# Patient Record
Sex: Male | Born: 1960 | Race: White | Hispanic: No | Marital: Married | Smoking: Never smoker
Health system: Southern US, Community
[De-identification: ages and names within clinical notes are randomized; demographics above are authoritative.]

## PROBLEM LIST (undated history)

## (undated) DIAGNOSIS — I351 Nonrheumatic aortic (valve) insufficiency: Secondary | ICD-10-CM

## (undated) DIAGNOSIS — E785 Hyperlipidemia, unspecified: Secondary | ICD-10-CM

## (undated) DIAGNOSIS — K589 Irritable bowel syndrome without diarrhea: Secondary | ICD-10-CM

## (undated) DIAGNOSIS — I479 Paroxysmal tachycardia, unspecified: Secondary | ICD-10-CM

## (undated) DIAGNOSIS — I714 Abdominal aortic aneurysm, without rupture, unspecified: Secondary | ICD-10-CM

## (undated) DIAGNOSIS — Z860100 Personal history of colon polyps, unspecified: Secondary | ICD-10-CM

## (undated) HISTORY — DX: Paroxysmal tachycardia, unspecified: I47.9

## (undated) HISTORY — DX: Hyperlipidemia, unspecified: E78.5

## (undated) HISTORY — DX: Nonrheumatic aortic (valve) insufficiency: I35.1

## (undated) HISTORY — DX: Irritable bowel syndrome, unspecified: K58.9

## (undated) HISTORY — DX: Personal history of colon polyps, unspecified: Z86.0100

## (undated) HISTORY — DX: Abdominal aortic aneurysm, without rupture, unspecified: I71.40

---

## 2003-09-20 ENCOUNTER — Encounter: Admission: RE | Admit: 2003-09-20 | Discharge: 2003-09-20 | Payer: Self-pay | Admitting: Family Medicine

## 2003-09-20 ENCOUNTER — Encounter: Payer: Self-pay | Admitting: Family Medicine

## 2009-05-30 ENCOUNTER — Encounter: Admission: RE | Admit: 2009-05-30 | Discharge: 2009-05-30 | Payer: Self-pay | Admitting: Family Medicine

## 2010-12-22 ENCOUNTER — Encounter: Payer: Self-pay | Admitting: Family Medicine

## 2013-10-03 ENCOUNTER — Other Ambulatory Visit (HOSPITAL_COMMUNITY): Payer: Self-pay | Admitting: Family Medicine

## 2013-10-03 DIAGNOSIS — I359 Nonrheumatic aortic valve disorder, unspecified: Secondary | ICD-10-CM

## 2013-10-05 ENCOUNTER — Ambulatory Visit (HOSPITAL_COMMUNITY): Payer: BC Managed Care – PPO | Attending: Internal Medicine | Admitting: Radiology

## 2013-10-05 DIAGNOSIS — E785 Hyperlipidemia, unspecified: Secondary | ICD-10-CM | POA: Insufficient documentation

## 2013-10-05 DIAGNOSIS — I359 Nonrheumatic aortic valve disorder, unspecified: Secondary | ICD-10-CM

## 2013-10-05 DIAGNOSIS — I059 Rheumatic mitral valve disease, unspecified: Secondary | ICD-10-CM | POA: Insufficient documentation

## 2013-10-05 DIAGNOSIS — I77819 Aortic ectasia, unspecified site: Secondary | ICD-10-CM | POA: Insufficient documentation

## 2013-10-05 NOTE — Progress Notes (Signed)
Echocardiogram performed.  

## 2013-11-01 ENCOUNTER — Encounter: Payer: BC Managed Care – PPO | Admitting: Surgery

## 2013-11-01 ENCOUNTER — Other Ambulatory Visit: Payer: Self-pay | Admitting: Family Medicine

## 2013-11-01 DIAGNOSIS — I359 Nonrheumatic aortic valve disorder, unspecified: Secondary | ICD-10-CM

## 2013-11-06 ENCOUNTER — Other Ambulatory Visit: Payer: BC Managed Care – PPO

## 2013-11-29 ENCOUNTER — Encounter: Payer: BC Managed Care – PPO | Admitting: Surgery

## 2013-11-29 ENCOUNTER — Encounter: Payer: BC Managed Care – PPO | Admitting: Vascular Surgery

## 2013-11-29 ENCOUNTER — Other Ambulatory Visit (HOSPITAL_COMMUNITY): Payer: BC Managed Care – PPO

## 2013-12-01 ENCOUNTER — Ambulatory Visit
Admission: RE | Admit: 2013-12-01 | Discharge: 2013-12-01 | Disposition: A | Discharge status: Home / Self Care | Payer: Self-pay | Source: Ambulatory Visit | Attending: Family Medicine | Admitting: Family Medicine

## 2013-12-01 DIAGNOSIS — I359 Nonrheumatic aortic valve disorder, unspecified: Secondary | ICD-10-CM

## 2013-12-01 MED ORDER — IOHEXOL 350 MG/ML SOLN
80.0000 mL | Freq: Once | INTRAVENOUS | Status: AC | PRN
  Administered 2013-12-01: 80 mL via INTRAVENOUS

## 2013-12-06 ENCOUNTER — Encounter: Payer: Self-pay | Admitting: Surgery

## 2013-12-06 ENCOUNTER — Encounter: Payer: Self-pay | Admitting: *Deleted

## 2013-12-06 ENCOUNTER — Institutional Professional Consult (permissible substitution) (INDEPENDENT_AMBULATORY_CARE_PROVIDER_SITE_OTHER): Payer: BC Managed Care – PPO | Admitting: Surgery

## 2013-12-06 VITALS — BP 137/93 | HR 87 | Resp 16 | Ht 72.0 in | Wt 210.0 lb

## 2013-12-06 DIAGNOSIS — I712 Thoracic aortic aneurysm, without rupture, unspecified: Secondary | ICD-10-CM

## 2013-12-06 NOTE — Progress Notes (Signed)
PCP is Lupe Carney, MD Referring Provider is Lupe Carney, MD  Chief Complaint  Patient presents with  . Thoracic Aortic Aneurysm    Surgical eval on aortic aneurysm, ECHO 10/05/13, CTA Chest 11/01/2013    HPI:  53 year old gentleman with a hx of hyperlipidemia and known aortic root enlargement by prior echocardiogram several years ago. Recent 2D echo on 10/05/2013 showed an aortic root diameter of 4.5 cm and reportedly prior echo had shown a diameter of 4.2 cm. The aortic valve is trileaflet with trivial regurgitation. CT angio on 12/01/2013 shows the aortic root diameter to be 45 mm with tapering up to the aortic arch where the diameter is 23 mm.   He denies any chest pain but has had recent brief episodes of tachypalpitations that he feels may be related to excess caffeine intake.   Past Medical History  Diagnosis Date  . Hyperlipidemia   . Aortic insufficiency   . IBS (irritable bowel syndrome)   . Tachycardia, paroxysmal     History reviewed. No pertinent past surgical history.  Family history reviewed. His mother has a hx of heart disease but he does not have details.  Social History History  Substance Use Topics  . Smoking status: Never Smoker   . Smokeless tobacco: Never Used  . Alcohol Use: No    Current Outpatient Prescriptions  Medication Sig Dispense Refill  . hyoscyamine (ANASPAZ) 0.125 MG TBDP disintergrating tablet Place 0.125 mg under the tongue every 4 (four) hours as needed.        No current facility-administered medications for this visit.    Allergies  Allergen Reactions  . Simvastatin Other (See Comments)    Joint aches  . Ampicillin Rash    Review of Systems  Constitutional: Negative.   HENT: Negative.   Eyes: Negative.   Respiratory: Negative for chest tightness and shortness of breath.   Cardiovascular: Positive for palpitations. Negative for chest pain and leg swelling.       No PND or orthopnea  Gastrointestinal: Negative.    Endocrine: Negative.   Genitourinary: Negative.   Musculoskeletal: Negative.   Skin: Negative.   Allergic/Immunologic: Negative.   Neurological: Negative.   Hematological: Negative.   Psychiatric/Behavioral: Negative.     BP 137/93  Pulse 87  Resp 16  Ht 6' (1.829 m)  Wt 210 lb (95.255 kg)  BMI 28.47 kg/m2  SpO2 97% Physical Exam  Constitutional: He is oriented to person, place, and time. He appears well-developed and well-nourished. No distress.  HENT:  Head: Normocephalic and atraumatic.  Mouth/Throat: Oropharynx is clear and moist.  Eyes: EOM are normal. Pupils are equal, round, and reactive to light.  Neck: Normal range of motion. Neck supple. No JVD present. No thyromegaly present.  Cardiovascular: Normal rate, regular rhythm, normal heart sounds and intact distal pulses.  Exam reveals no gallop and no friction rub.   No murmur heard. Pulmonary/Chest: Effort normal and breath sounds normal. No respiratory distress. He has no wheezes. He has no rales.  Abdominal: Soft. Bowel sounds are normal. He exhibits no distension and no mass. There is no tenderness.  Musculoskeletal: Normal range of motion. He exhibits no edema.  Lymphadenopathy:    He has no cervical adenopathy.  Neurological: He is alert and oriented to person, place, and time. He has normal strength. No cranial nerve deficit or sensory deficit.  Skin: Skin is warm and dry.  Psychiatric: He has a normal mood and affect.  Diagnostic Tests:  [facility] [address] [place], [place] [address] [phone]  ------------------------------------------------------------ Transthoracic Echocardiography  Patient: Robert Madden, Robert Madden MR #: [id number] Study Date: 10/05/2013 Gender: M Age: 51 Height: 182.9cm Weight: 90.7kg BSA: 2.5763m^2 Pt. Status: Room:  ATTENDING Dietrich Patesoss, [facility], Echo SONOGRAPHER Aida RaiderNaTashia Rodgers, RDCS ORDERING Lupe CarneyMitchell, Dean Kristopher OppenheimEFERRING Mitchell,  Dean cc:  ------------------------------------------------------------ LV EF: 55% - 60%  ------------------------------------------------------------ Indications: Aortic Valve Disorder 424.1.  ------------------------------------------------------------ History: PMH: Acquired from the patient and from the patient's chart. PMH: Dilated Aortic Root. Aortic Insufficiency. Risk factors: Dyslipidemia.  ------------------------------------------------------------ Study Conclusions  - Left ventricle: The cavity size was normal. There was mild focal basal hypertrophy of the septum. Systolic function was normal. The estimated ejection fraction was in the range of 55% to 60%. Wall motion was normal; there were no regional wall motion abnormalities. - Aortic valve: Trivial regurgitation. - Aorta: The aorta was moderately dilated. - Mitral valve: Calcified annulus. Transthoracic echocardiography. M-mode, complete 2D, spectral Doppler, and color Doppler. Height: Height: 182.9cm. Height: 72in. Weight: Weight: 90.7kg. Weight: 199.6lb. Body mass index: BMI: 27.1kg/m^2. Body surface area: BSA: 2.11063m^2. Patient status: Outpatient.  ------------------------------------------------------------  ------------------------------------------------------------ Left ventricle: The cavity size was normal. There was mild focal basal hypertrophy of the septum. Systolic function was normal. The estimated ejection fraction was in the range of 55% to 60%. Wall motion was normal; there were no regional wall motion abnormalities.  ------------------------------------------------------------ Aortic valve: Trileaflet. Doppler: There was no stenosis. Trivial regurgitation.  ------------------------------------------------------------ Aorta: The aorta was moderately dilated.  ------------------------------------------------------------ Mitral valve: Calcified  annulus.  ------------------------------------------------------------ Left atrium: The atrium was normal in size.  ------------------------------------------------------------ Atrial septum: Poorly visualized.  ------------------------------------------------------------ Right ventricle: The cavity size was normal. Wall thickness was normal. Systolic function was normal.  ------------------------------------------------------------ Pulmonic valve: Structurally normal valve. Cusp separation was normal. Doppler: Transvalvular velocity was within the normal range. Trivial regurgitation.  ------------------------------------------------------------ Tricuspid valve: Structurally normal valve. Leaflet separation was normal. Doppler: Transvalvular velocity was within the normal range. No regurgitation.  ------------------------------------------------------------ Pulmonary artery: Poorly visualized.  ------------------------------------------------------------ Right atrium: The atrium was normal in size.  ------------------------------------------------------------ Pericardium: There was no pericardial effusion.  ------------------------------------------------------------ Systemic veins: Inferior vena cava: The vessel was normal in size; the respirophasic diameter changes were in the normal range (= 50%); findings are consistent with normal central venous pressure.  ------------------------------------------------------------ Post procedure conclusions Ascending Aorta:  - The aorta was moderately dilated.  ------------------------------------------------------------  2D measurements Normal Doppler measurements Normal Left ventricle Left ventricle LVID ED, 37.3 mm 43-52 Ea, lat 11.2 cm/s ------ chord, ann, tiss PLAX DP LVID ES, 21.5 mm 23-38 E/Ea, lat 5.94 ------ chord, ann, tiss PLAX DP FS, chord, 42 % >29 Ea, med 8.33 cm/s ------ PLAX ann, tiss LVPW, ED 9.59 mm  ------ DP IVS/LVPW 1.67 <1.3 E/Ea, med 7.98 ------ ratio, ED ann, tiss Ventricular septum DP IVS, ED 16 mm ------ LVOT LVOT Peak vel, 118 cm/s ------ Diam, S 22 mm ------ S Area 3.8 cm^2 ------ VTI, S 27.3 cm ------ Diam 22 mm ------ Peak 6 mm Hg ------ Aorta gradient, Root diam, 45 mm ------ S ED Stroke vol 103.8 ml ------ Left atrium Stroke 48.7 ml/m^ ------ AP dim 36 mm ------ index 2 AP dim 1.69 cm/m^2 <2.2 Mitral valve index Peak E vel 66.5 cm/s ------ Peak A vel 49.4 cm/s ------ Decelerati 215 ms 150-23 on time 0 Peak E/A 1.3 ------ ratio Right ventricle Sa vel, 14.4 cm/s ------ lat ann, tiss DP  ------------------------------------------------------------  Prepared and Electronically Authenticated by  Lyn Records. 2014-11-06T14:37:35.080   CLINICAL DATA: Evaluate size of the thoracic aorta. The thoracic  aorta measured approximately 4.2 cm on an outside examination  performed 06/2009, though cardiac echo suggests interval enlargement  currently measuring approximately 4.5 cm. Patient reports chest  discomfort and cardiac arrhythmia.  EXAM:  CT ANGIOGRAPHY CHEST WITH CONTRAST  TECHNIQUE:  Multidetector CT imaging of the chest was performed using the  standard protocol during bolus administration of intravenous  contrast. Multiplanar CT image reconstructions including MIPs were  obtained to evaluate the vascular anatomy.  CONTRAST: 80mL OMNIPAQUE IOHEXOL 350 MG/ML SOLN  COMPARISON: None.  FINDINGS:  Vascular Findings:  There is fusiform ectasia of the ascending thoracic aorta beginning  at the level of the aortic root. The thoracic aorta tapers to a  normal caliber at the level of the aortic arch. Incidental note is  made of a small ductus diverticulum. No thoracic aortic dissection  or periaortic stranding.  The left vertebral artery is and filling noted to arise directly  from the aortic arch. The branch vessels of the aortic arch are  widely patent  throughout their imaged course.  Borderline cardiomegaly. No pericardial effusion. No definitive  calcifications within the aortic valve leaflets.  The imaged abdominal aorta and major branch vessels appear normal.  -------------------------------------------------------------  Thoracic aortic measurements:  Sinuses of Valsalva:  45 mm as measured in greatest oblique coronal dimension (coronal  image 56, series 601).  Sinotubular junction  41 mm as measured in greatest oblique coronal dimension.  Proximal ascending aorta  35 mm as measured in greatest oblique axial dimension at the level  of the main pulmonary artery. Approximately 36 mm in greatest  oblique coronal dimension (coronal image 51, series 601).  Aortic arch aorta  23 mm as measured in greatest oblique sagittal dimension.  Proximal descending thoracic aorta  23 mm as measured in greatest oblique axial dimension at the level  of the main pulmonary artery.  Distal descending thoracic aorta  22 mm as measured in greatest oblique axial dimension at the level  of the diaphragmatic hiatus.  Review of the MIP images confirms the above findings.  -------------------------------------------------------------  Non-Vascular Findings:  There is a minimal grossly symmetric subpleural ground-glass  atelectasis within the dependent portions of the bilateral upper and  lower lobes. No discrete focal airspace opacities. No discrete  pulmonary nodules. No pleural effusion or pneumothorax. The central  pulmonary airways appear widely patent.  Scattered shotty mediastinal lymph nodes are individually not  enlarged by size criteria. Shotty right infrahilar lymph nodes are  not enlarged by size criteria with index right infrahilar nodal  conglomeration measuring approximately 0.9 cm in diameter (image 66,  series 3). No mediastinal, hilar or axillary lymphadenopathy.  Early arterial phase evaluation of the upper abdomen demonstrates a   small spine normal but is otherwise unremarkable.  No acute or aggressive osseous abnormalities. Normal appearance of  the thyroid gland.  IMPRESSION:  Fusiform ectasia of the aortic root measuring approximately 45 mm at  the level of the sinuses of Valsalva. The thoracic aorta tapers to a  normal caliber at the level of the aortic arch. No thoracic aortic  dissection or periaortic stranding. No definitive aortic valve  calcifications on this nongated examination.  Electronically Signed  By: Simonne Come M.Madden.  On: 12/01/2013 10:39    Impression:  He has stable fusiform enlargement of the aortic root to 45 mm at the level of the  aortic sinuses with a trileaflet valve which is probably unchanged compared to his previous echo a few years ago. The difference between 42 mm and 45 mm by echo is within the variance for that technigue which is technologist dependent. The aortic root at the sinus level is usually larger than the ascending aorta. I would not recommend surgery for this unless there is rapid enlargement of 10 mm in a year or if it reached 5.5 cm. It may stay stable for a long time. At its current size the risk of surgery is higher than the risk of aortic complications. I would recommend continued follow up with a repeat CTA in 1 year. His tachypalpitations are unrelated to this and could be due to sensitivity to caffeine as he has noted. If these persist he may require further evaluation. He does have some hypertension and with an aneurysm he would benefit from a beta blocker to decrease the aortic shear force. This may also help his palpitations. I will leave that decision up to Dr. Clovis Riley.   Plan:  I will see him back in 1 year with a CTA of the chest.

## 2014-02-27 LAB — HM COLONOSCOPY

## 2014-11-28 ENCOUNTER — Other Ambulatory Visit: Payer: Self-pay | Admitting: *Deleted

## 2014-11-28 DIAGNOSIS — I712 Thoracic aortic aneurysm, without rupture, unspecified: Secondary | ICD-10-CM

## 2014-12-04 ENCOUNTER — Other Ambulatory Visit: Payer: Self-pay

## 2014-12-04 DIAGNOSIS — I712 Thoracic aortic aneurysm, without rupture, unspecified: Secondary | ICD-10-CM

## 2014-12-19 ENCOUNTER — Ambulatory Visit (INDEPENDENT_AMBULATORY_CARE_PROVIDER_SITE_OTHER): Payer: BC Managed Care – PPO | Admitting: Surgery

## 2014-12-19 ENCOUNTER — Encounter: Payer: Self-pay | Admitting: Surgery

## 2014-12-19 ENCOUNTER — Ambulatory Visit
Admission: RE | Admit: 2014-12-19 | Discharge: 2014-12-19 | Disposition: A | Discharge status: Home / Self Care | Payer: BC Managed Care – PPO | Source: Ambulatory Visit | Attending: Surgery | Admitting: Surgery

## 2014-12-19 VITALS — BP 137/88 | HR 140 | Resp 20 | Ht 72.0 in | Wt 210.0 lb

## 2014-12-19 DIAGNOSIS — I712 Thoracic aortic aneurysm, without rupture, unspecified: Secondary | ICD-10-CM

## 2014-12-19 MED ORDER — IOHEXOL 350 MG/ML SOLN
75.0000 mL | Freq: Once | INTRAVENOUS | Status: AC | PRN
  Administered 2014-12-19: 75 mL via INTRAVENOUS

## 2014-12-19 NOTE — Progress Notes (Signed)
HPI:  Robert Madden returns for follow up of an enlarged aortic root. I last saw him on 12/06/2013 for an initial consultation and his aortic root was 45 mm at that time. His aortic valve was noted to be tricuspid on echo from 10/05/2013. Robert Madden has felt well over the past year with no chest or back pain and no shortness of breath.   Current Outpatient Prescriptions  Medication Sig Dispense Refill  . atorvastatin (LIPITOR) 20 MG tablet Take 20 mg by mouth daily at 6 PM.   2  . hyoscyamine (ANASPAZ) 0.125 MG TBDP disintergrating tablet Place 0.125 mg under the tongue every 4 (four) hours as needed.      No current facility-administered medications for this visit.     Physical Exam: BP 137/88 mmHg  Pulse 140  Resp 20  Ht 6' (1.829 m)  Wt 210 lb (95.255 kg)  BMI 28.47 kg/m2  SpO2 98% Robert Madden looks well Cardiac exam shows a regular rate and rhythm with tachycardia and no murmur Lungs are clear  Diagnostic Tests:  CLINICAL DATA: Follow-up thoracic aortic aneurysm.  EXAM: CT ANGIOGRAPHY CHEST WITH CONTRAST  TECHNIQUE: Multidetector CT imaging of the chest was performed using the standard protocol during bolus administration of intravenous contrast. Multiplanar CT image reconstructions and MIPs were obtained to evaluate the vascular anatomy.  CONTRAST: 75mL OMNIPAQUE IOHEXOL 350 MG/ML SOLN  COMPARISON: Chest CT-12/01/2013  FINDINGS: Vascular Findings:  There is fusiform aneurysmal dilatation of the aortic root and proximal ascending thoracic aorta, minimally increased in the interval with measurements as follows. The ascending thoracic aorta tapers to a normal caliber at the level of the aortic arch. No definite thoracic aortic dissection or periaortic stranding on this nongated examination.  The left vertebral artery is incidentally noted to arise directly from the aortic arch. The branch vessels of the aortic arch are widely patent throughout their imaged course.  Incidental note is made of a small ductus diverticulum.  Normal heart size. No pericardial effusion though there is a trace amount of fluid seen within the pericardial recess, similar to the prior examination.  Normal caliber the main pulmonary artery. Although this examination was not tailored for the evaluation of the pulmonary arteries, there are no discrete filling defects within the central pulmonary arterial tree to suggest central pulmonary embolism.  -------------------------------------------------------------  Thoracic aortic measurements:  Sinuses of Valsalva:  47 mm as measured in greatest oblique coronal dimension (image 53, series 601), previously, 45 mm.  Sinotubular junction  42 mm as measured in greatest oblique coronal dimension, previously, 41 mm.  Proximal ascending aorta  39 mm as measured in greatest oblique axial dimension at the level of the main pulmonary artery (image 66, series 4), previously, 35 mm; and approximately 38 mm in greatest oblique coronal dimension (coronal image 49, series 601), previously, 36 mm.  Aortic arch aorta  24 mm as measured in greatest oblique sagittal dimension.  Proximal descending thoracic aorta  24 mm as measured in greatest oblique axial dimension at the level of the main pulmonary artery.  Distal descending thoracic aorta  23 mm as measured in greatest oblique axial dimension at the level of the diaphragmatic hiatus.  Review of the MIP images confirms the above findings.  -------------------------------------------------------------  Non-Vascular Findings:  Minimal dependent subpleural ground-glass atelectasis. No discrete focal airspace opacities. No pleural effusion or pneumothorax. The central pulmonary airways appear widely patent.  No discrete pulmonary nodules. Scattered shotty mediastinal lymph nodes are individually not enlarged  by size criteria. No mediastinal, hilar or  axial lymphadenopathy.  Limited early arterial phase evaluation of the upper abdomen is normal. Incidental note is made of a small splenule.  No acute or aggressive osseous abnormalities.  Regional soft tissues appear normal. Normal appearance of the thyroid gland.  IMPRESSION: Minimal increase in fusiform ectasia involving the aortic root and proximal ascending thoracic aorta, now measuring 47 mm at the level of the sinuses of Valsalva (previously - 45 mm) and 39 mm at the level of the proximal ascending thoracic aorta (previously - 35 mm). No definite thoracic aortic dissection or periaortic stranding.   Electronically Signed  By: Simonne ComeJohn Watts M.D.  On: 12/19/2014 14:56    Impression:  There has been a slight increase in the size of his aortic root, from 4.5 cm to 4.7 cm. This could still be within the limits of error depending on the scan and where there diameter was measured. There is no indication for surgical intervention unless there is slow progressive enlargement up to 5.5 cm or rapid enlargement of 5 mm or greater in 1 year. Robert Madden is tachycardic today and reports a history of brief episodes of tachypalpitations like this in the past that are asymptomatic although Robert Madden knows when they occur. Robert Madden feels that caffeine plays a part but not exertion. Robert Madden has not wanted to work them up further since they don't occur very often and don't last long.   Plan:  I will see him back in 1 year with a CTA of the chest to reassess the aortic root aneurysm.

## 2014-12-26 ENCOUNTER — Ambulatory Visit: Payer: Self-pay | Admitting: Surgery

## 2015-02-25 DIAGNOSIS — E7800 Pure hypercholesterolemia, unspecified: Secondary | ICD-10-CM | POA: Insufficient documentation

## 2015-08-30 ENCOUNTER — Encounter: Payer: Self-pay | Admitting: Physician Assistant

## 2015-08-30 ENCOUNTER — Ambulatory Visit (INDEPENDENT_AMBULATORY_CARE_PROVIDER_SITE_OTHER): Payer: BC Managed Care – PPO | Admitting: Physician Assistant

## 2015-08-30 VITALS — BP 120/80 | HR 88 | Ht 72.0 in | Wt 218.0 lb

## 2015-08-30 DIAGNOSIS — I712 Thoracic aortic aneurysm, without rupture, unspecified: Secondary | ICD-10-CM

## 2015-08-30 DIAGNOSIS — I471 Supraventricular tachycardia: Secondary | ICD-10-CM | POA: Insufficient documentation

## 2015-08-30 MED ORDER — METOPROLOL TARTRATE 25 MG PO TABS: 25.0000 mg | ORAL_TABLET | Freq: Two times a day (BID) | ORAL | Status: DC

## 2015-08-30 NOTE — Addendum Note (Signed)
Addended by: Reesa Chew on: 08/30/2015 03:33 PM   Modules accepted: Medications

## 2015-08-30 NOTE — Progress Notes (Signed)
Patient ID: Robert Madden, male   DOB: March 29, 1961, 54 y.o.   MRN: [id number]    Date:  08/30/2015   ID:  Robert Madden, DOB [birth year], MRN [id number]  PCP:  Lupe Carney, MD  Primary Cardiologist:  new   Chief complaint:  Rapid heart rate   History of Present Illness: Robert Madden is a 54 y.o. male with a history of hyperlipidemia, aortic insufficiency, IBS, paroxysmal tachycardia, hyperlipidemia.  He had an echocardiogram 10/05/2013 which reveals an ejection fraction 55-60% with normal wall motion the aorta was mild moderately dilated aortic valve with trivial regurgitation mitral valve calcified annulus right ventricle was normal in size and function trivial pulmonic valve regurgitation.  His CT angiogram of the chest January 2015 which revealed fusiform ectasia of the aortic root measuring 45 mm at the level of the sinuses of Valsalva. His most recent CT showed mild increase in size to 4.7 cm.  He is followed by Dr. Lavinia Sharps.  Patient resents with complaints of a randomly occurring rapid heart rate.  The longest occurrence was about 7 minutes.  The first occurred back in January with intractable frequency since July. This probably occurred in the range of 20-30 times. Denies any dizziness or lightheadedness or presyncopal symptoms. No chest pain or tightness.    The patient currently denies nausea, vomiting, fever, orthopnea, dizziness, PND, cough, congestion, abdominal pain, hematochezia, melena, lower extremity edema, claudication.  Wt Readings from Last 3 Encounters:  08/30/15 218 lb (98.884 kg)  12/19/14 210 lb (95.255 kg)  12/06/13 210 lb (95.255 kg)     Past Medical History  Diagnosis Date  . Hyperlipidemia   . Aortic insufficiency   . IBS (irritable bowel syndrome)   . Tachycardia, paroxysmal     Current Outpatient Prescriptions  Medication Sig Dispense Refill  . aspirin 81 MG tablet Take 81 mg by mouth daily.    Marland Kitchen FLUVIRIN PRESERVATIVE FREE 0.5 ML SUSY inject  0.5 milliliter intramuscularly  0  . hyoscyamine (ANASPAZ) 0.125 MG TBDP disintergrating tablet Place 0.125 mg under the tongue every 4 (four) hours as needed (reflux).     . pravastatin (PRAVACHOL) 40 MG tablet TK 1 T PO QD  12  . ZOSTAVAX 75643 UNT/0.65ML injection inject contents of 1 vial subcutaneously  0  . metoprolol tartrate (LOPRESSOR) 25 MG tablet Take 1 tablet (25 mg total) by mouth 2 (two) times daily. 180 tablet 3   No current facility-administered medications for this visit.    Allergies:    Allergies  Allergen Reactions  . Simvastatin Other (See Comments)    Joint aches  . Ampicillin Rash    Social History:  The patient  reports that he has never smoked. He has never used smokeless tobacco. He reports that he does not drink alcohol or use illicit drugs.   Family history:  No family history on file.  ROS:  Please see the history of present illness.  All other systems reviewed and negative.   PHYSICAL EXAM: VS:  BP 120/80 mmHg  Pulse 88  Ht 6' (1.829 m)  Wt 218 lb (98.884 kg)  BMI 29.56 kg/m2 Well nourished, well developed, in no acute distress HEENT: Pupils are equal round react to light accommodation extraocular movements are intact.  Neck: no JVDNo cervical lymphadenopathy. Cardiac: Regular rhythm rate fast no murmurs rubs or gallops Lungs:  clear to auscultation bilaterally, no wheezing, rhonchi or rales Abd: soft, nontender, positive bowel sounds all quadrants, no hepatosplenomegaly Ext: no lower extremity  edema.  2+ radial and dorsalis pedis pulses. Skin: warm and dry Neuro:  Grossly normal  EKG:  PJRT rate 141  ASSESSMENT AND PLAN:  Problem List Items Addressed This Visit    Permanent junctional reciprocating tachycardia/ SVT   Relevant Medications   pravastatin (PRAVACHOL) 40 MG tablet   aspirin 81 MG tablet   metoprolol tartrate (LOPRESSOR) 25 MG tablet   Aortic aneurysm, thoracic - Primary   Relevant Medications   pravastatin (PRAVACHOL) 40 MG  tablet   aspirin 81 MG tablet   metoprolol tartrate (LOPRESSOR) 25 MG tablet   Other Relevant Orders   EKG 12-Lead    Other Visit Diagnoses    SVT (supraventricular tachycardia)        Relevant Medications    pravastatin (PRAVACHOL) 40 MG tablet    aspirin 81 MG tablet    metoprolol tartrate (LOPRESSOR) 25 MG tablet    Other Relevant Orders    Echocardiogram      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 He subsequently went into rapid heart rate of about 141 bpm. Dr. Graciela Husbands was notified subsequently applied carotid massage which broke the rhythm for very short period. Patient was administered 5 mg of by systolic.  Patient reverted back to normal sinus rhythm after about 25 minutes. He was started on Lopressor 25 mg twice daily. He'll continue to track the frequency of his episodes and will follow back up with Dr. Ladona Ridgel in 2 months.  We'll also check a 2-D echocardiogram.

## 2015-08-30 NOTE — Patient Instructions (Addendum)
Medication Instructions:  Your physician has recommended you make the following change in your medication:  1.  START Lopressor 25 mg taking 1 tablet twice a day.   Labwork: None ordered  Testing/Procedures: Your physician has requested that you have an echocardiogram. Echocardiography is a painless test that uses sound waves to create images of your heart. It provides your doctor with information about the size and shape of your heart and how well your heart's chambers and valves are working. This procedure takes approximately one hour. There are no restrictions for this procedure.    Follow-Up: Your physician wants you to follow-up in:  2 MONTHS WITH DR. Ladona Ridgel TO DISCUSS ABLATION.   SPECIAL INSTRUCTIONS:  Echocardiogram An echocardiogram, or echocardiography, uses sound waves (ultrasound) to produce an image of your heart. The echocardiogram is simple, painless, obtained within a short period of time, and offers valuable information to your health care provider. The images from an echocardiogram can provide information such as:  Evidence of coronary artery disease (CAD).  Heart size.  Heart muscle function.  Heart valve function.  Aneurysm detection.  Evidence of a past heart attack.  Fluid buildup around the heart.  Heart muscle thickening.  Assess heart valve function. LET [facility] CARE PROVIDER KNOW ABOUT:  Any allergies you have.  All medicines you are taking, including vitamins, herbs, eye drops, creams, and over-the-counter medicines.  Previous problems you or members of your family have had with the use of anesthetics.  Any blood disorders you have.  Previous surgeries you have had.  Medical conditions you have.  Possibility of pregnancy, if this applies. BEFORE THE PROCEDURE  No special preparation is needed. Eat and drink normally.  PROCEDURE   In order to produce an image of your heart, gel will be applied to your chest and a wand-like tool  (transducer) will be moved over your chest. The gel will help transmit the sound waves from the transducer. The sound waves will harmlessly bounce off your heart to allow the heart images to be captured in real-time motion. These images will then be recorded.  You may need an IV to receive a medicine that improves the quality of the pictures. AFTER THE PROCEDURE You may return to your normal schedule including diet, activities, and medicines, unless your health care provider tells you otherwise. Document Released: 11/13/2000 Document Revised: 04/02/2014 Document Reviewed: 07/24/2013 [facility] Patient Information 2015 [place], [place]. This information is not intended to replace advice given to you by your health care provider. Make sure you discuss any questions you have with your health care provider.   Cardiac Ablation Cardiac ablation is a procedure to disable a small amount of heart tissue in very specific places. The heart has many electrical connections. Sometimes these connections are abnormal and can cause the heart to beat very fast or irregularly. By disabling some of the problem areas, heart rhythm can be improved or made normal. Ablation is done for people who:   Have Wolff-Parkinson-White syndrome.   Have other fast heart rhythms (tachycardia).   Have taken medicines for an abnormal heart rhythm (arrhythmia) that resulted in:   No success.   Side effects.   May have a high-risk heartbeat that could result in death.  LET [facility] CARE PROVIDER KNOW ABOUT:   Any allergies you have or any previous reactions you have had to X-ray dye, food (such as seafood), medicine, or tape.   All medicines you are taking, including vitamins, herbs, eye drops, creams, and  over-the-counter medicines.   Previous problems you or members of your family have had with the use of anesthetics.   Any blood disorders you have.   Previous surgeries or procedures (such as a kidney  transplant) you have had.   Medical conditions you have (such as kidney failure).  RISKS AND COMPLICATIONS Generally, cardiac ablation is a safe procedure. However, problems can occur and include:   Increased risk of cancer. Depending on how long it takes to do the ablation, the dose of radiation can be high.  Bruising and bleeding where a thin, flexible tube (catheter) was inserted during the procedure.   Bleeding into the chest, especially into the sac that surrounds the heart (serious).  Need for a permanent pacemaker if the normal electrical system is damaged.   The procedure may not be fully effective, and this may not be recognized for months. Repeat ablation procedures are sometimes required. BEFORE THE PROCEDURE   Follow any instructions from your health care provider regarding eating and drinking before the procedure.   Take your medicines as directed at regular times with water, unless instructed otherwise by your health care provider. If you are taking diabetes medicine, including insulin, ask how you are to take it and if there are any special instructions you should follow. It is common to adjust insulin dosing the day of the ablation.  PROCEDURE  An ablation is usually performed in a catheterization laboratory with the guidance of fluoroscopy. Fluoroscopy is a type of X-ray that helps your health care provider see images of your heart during the procedure.   An ablation is a minimally invasive procedure. This means a small cut (incision) is made in either your neck or groin. Your health care provider will decide where to make the incision based on your medical history and physical exam.  An IV tube will be started before the procedure begins. You will be given an anesthetic or medicine to help you relax (sedative).  The skin on your neck or groin will be numbed. A needle will be inserted into a large vein in your neck or groin and catheters will be threaded to your  heart.  A special dye that shows up on fluoroscopy pictures may be injected through the catheter. The dye helps your health care provider see the area of the heart that needs treatment.  The catheter has electrodes on the tip. When the area of heart tissue that is causing the arrhythmia is found, the catheter tip will send an electrical current to the area and "scar" the tissue. Three types of energy can be used to ablate the heart tissue:   Heat (radiofrequency energy).   Laser energy.   Extreme cold (cryoablation).   When the area of the heart has been ablated, the catheter will be taken out. Pressure will be held on the insertion site. This will help the insertion site clot and keep it from bleeding. A bandage will be placed on the insertion site.  AFTER THE PROCEDURE   After the procedure, you will be taken to a recovery area where your vital signs (blood pressure, heart rate, and breathing) will be monitored. The insertion site will also be monitored for bleeding.   You will need to lie still for 4-6 hours. This is to ensure you do not bleed from the catheter insertion site.  Document Released: 04/04/2009 Document Revised: 04/02/2014 Document Reviewed: 04/10/2013 [facility] Patient Information 2015 [place], [place]. This information is not intended to replace advice given to  you by your health care provider. Make sure you discuss any questions you have with your health care provider.  

## 2015-09-09 ENCOUNTER — Other Ambulatory Visit: Payer: Self-pay

## 2015-09-09 ENCOUNTER — Ambulatory Visit (HOSPITAL_COMMUNITY): Payer: BC Managed Care – PPO | Attending: Physician Assistant

## 2015-09-09 DIAGNOSIS — I340 Nonrheumatic mitral (valve) insufficiency: Secondary | ICD-10-CM | POA: Diagnosis not present

## 2015-09-09 DIAGNOSIS — I517 Cardiomegaly: Secondary | ICD-10-CM | POA: Diagnosis not present

## 2015-09-09 DIAGNOSIS — I471 Supraventricular tachycardia: Secondary | ICD-10-CM | POA: Diagnosis present

## 2015-09-09 DIAGNOSIS — I351 Nonrheumatic aortic (valve) insufficiency: Secondary | ICD-10-CM | POA: Diagnosis not present

## 2015-09-09 DIAGNOSIS — E785 Hyperlipidemia, unspecified: Secondary | ICD-10-CM | POA: Diagnosis not present

## 2015-10-23 ENCOUNTER — Ambulatory Visit (INDEPENDENT_AMBULATORY_CARE_PROVIDER_SITE_OTHER): Payer: BC Managed Care – PPO | Admitting: Internal Medicine

## 2015-10-23 ENCOUNTER — Other Ambulatory Visit: Payer: Self-pay

## 2015-10-23 ENCOUNTER — Encounter: Payer: Self-pay | Admitting: Internal Medicine

## 2015-10-23 VITALS — BP 122/78 | HR 74 | Ht 71.75 in | Wt 220.8 lb

## 2015-10-23 DIAGNOSIS — I471 Supraventricular tachycardia: Secondary | ICD-10-CM

## 2015-10-23 DIAGNOSIS — I712 Thoracic aortic aneurysm, without rupture, unspecified: Secondary | ICD-10-CM

## 2015-10-23 MED ORDER — METOPROLOL SUCCINATE ER 50 MG PO TB24: 50.0000 mg | ORAL_TABLET | Freq: Every day | ORAL | Status: DC

## 2015-10-23 NOTE — Assessment & Plan Note (Signed)
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 I have discussed the treatment options in detail. Because he has improved on beta blocker therapy, I have recommended a period of watchful waiting. If he becomes intolerant of his beta blocker or has recurrent break through episodes of SVT, would strongly consider catheter ablation.

## 2015-10-23 NOTE — Assessment & Plan Note (Signed)
He will continue to undergo watchful waiting. He is asymptomatic.

## 2015-10-23 NOTE — Patient Instructions (Signed)
Medication Instructions:  Your physician has recommended you make the following change in your medication:  1) Stop the twice daily Metoprolol 2) Start Toprol 50mg  daily   Labwork: None ordered   Testing/Procedures: None ordered   Follow-Up: Your physician wants you to follow-up in: 12 months with Dr Court Joyaylor You will receive a reminder letter in the mail two months in advance. If you don't receive a letter, please call our office to schedule the follow-up appointment.   Any Other Special Instructions Will Be Listed Below (If Applicable).     If you need a refill on your cardiac medications before your next appointment, please call your pharmacy.

## 2015-10-23 NOTE — Progress Notes (Signed)
      HPI [name] is referred today by [name] for evaluation of SVT. 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 He was seen in our office several months ago with these symptoms and was found to have a long RP tachycardia which was terminated with carotid massage. He was placed on metoprolol and returns for initial examination. The patient noted episodes occuring several times a week, lasting up to 30 minutes. These would stop and start suddenly. He never had syncope. He was placed on metoprolol and his symptoms have improved. He actually feels better as his blood pressure is under better control. No other complaints.  Allergies  Allergen Reactions  . Simvastatin Other (See Comments)    Joint aches  . Ampicillin Rash     Current Outpatient Prescriptions  Medication Sig Dispense Refill  . aspirin 81 MG tablet Take 81 mg by mouth daily.    . hyoscyamine (ANASPAZ) 0.125 MG TBDP disintergrating tablet Place 0.125 mg under the tongue every 4 (four) hours as needed (reflux).     . pravastatin (PRAVACHOL) 40 MG tablet Take 1 tablet by mouth every other day  12  . metoprolol succinate (TOPROL-XL) 50 MG 24 hr tablet Take 1 tablet (50 mg total) by mouth daily. Take with or immediately following a meal. 90 tablet 3   No current facility-administered medications for this visit.     Past Medical History  Diagnosis Date  . Hyperlipidemia   . Aortic insufficiency   . IBS (irritable bowel syndrome)   . Tachycardia, paroxysmal (HCC)     ROS:   All systems reviewed and negative except as noted in the HPI.   No past surgical history on file.   Family History  Problem Relation Age of Onset  . Heart attack Neg Hx   . Stroke Neg Hx   . Hypertension Mother      Social History   Social History  . Marital Status: Married    Spouse Name: N/A  . Number of Children:  2  . Years of Education: N/A   Occupational History  . school principal    Social History Main Topics  . Smoking status: Never Smoker   . Smokeless tobacco: Never Used  . Alcohol Use: No  . Drug Use: No  . Sexual Activity: Not on file   Other Topics Concern  . Not on file   Social History Narrative     BP 122/78 mmHg  Pulse 74  Ht 5' 11.75" (1.822 m)  Wt 220 lb 12.8 oz (100.154 kg)  BMI 30.17 kg/m2  Physical Exam:  Well appearing middle aged man, NAD HEENT: Unremarkable Neck:  6 cm JVD, no thyromegally Lymphatics:  No adenopathy Back:  No CVA tenderness Lungs:  Clear with no wheezes HEART:  Regular rate rhythm, no murmurs, no rubs, no clicks Abd:  soft, positive bowel sounds, no organomegally, no rebound, no guarding Ext:  2 plus pulses, no edema, no cyanosis, no clubbing Skin:  No rashes no nodules Neuro:  CN II through XII intact, motor grossly intact  EKG - nsr with no pre-excitation.    Assess/Plan:

## 2015-11-20 ENCOUNTER — Other Ambulatory Visit: Payer: Self-pay | Admitting: *Deleted

## 2015-11-20 DIAGNOSIS — I712 Thoracic aortic aneurysm, without rupture, unspecified: Secondary | ICD-10-CM

## 2015-12-25 ENCOUNTER — Other Ambulatory Visit: Payer: BC Managed Care – PPO

## 2015-12-25 ENCOUNTER — Encounter: Payer: BC Managed Care – PPO | Admitting: Surgery

## 2015-12-27 ENCOUNTER — Ambulatory Visit
Admission: RE | Admit: 2015-12-27 | Discharge: 2015-12-27 | Disposition: A | Discharge status: Home / Self Care | Payer: BC Managed Care – PPO | Source: Ambulatory Visit | Attending: Surgery | Admitting: Surgery

## 2015-12-27 DIAGNOSIS — I712 Thoracic aortic aneurysm, without rupture, unspecified: Secondary | ICD-10-CM

## 2015-12-27 MED ORDER — IOPAMIDOL (ISOVUE-370) INJECTION 76%
75.0000 mL | Freq: Once | INTRAVENOUS | Status: AC | PRN
Start: 2015-12-27 — End: 2015-12-27
  Administered 2015-12-27: 75 mL via INTRAVENOUS

## 2016-01-01 ENCOUNTER — Ambulatory Visit (INDEPENDENT_AMBULATORY_CARE_PROVIDER_SITE_OTHER): Payer: BC Managed Care – PPO | Admitting: Surgery

## 2016-01-01 ENCOUNTER — Encounter: Payer: Self-pay | Admitting: Surgery

## 2016-01-01 VITALS — BP 129/77 | HR 81 | Resp 16 | Ht 71.75 in | Wt 217.0 lb

## 2016-01-01 DIAGNOSIS — I712 Thoracic aortic aneurysm, without rupture, unspecified: Secondary | ICD-10-CM

## 2016-01-03 ENCOUNTER — Encounter: Payer: Self-pay | Admitting: Surgery

## 2016-01-03 NOTE — Progress Notes (Signed)
HPI:  He returns for follow up of an enlarged aortic root. I last saw him on 12/19/2014 and his aortic root was 47 mm at the level of the sinuses at that time, up from 45 mm the year before.  The proximal ascending aorta was 39 mm, up from 35 mm the year before. His aortic valve was noted to be tricuspid on echo from 10/05/2013. He has felt well over the past year with no chest or back pain and no shortness of breath. He was having some palpitations the last time I saw him but he was evaluated by Dr. Ladona Ridgel and put on Lopressor in September which has prevented any further episodes.  Current Outpatient Prescriptions  Medication Sig Dispense Refill  . aspirin 81 MG tablet Take 81 mg by mouth daily.    . hyoscyamine (ANASPAZ) 0.125 MG TBDP disintergrating tablet Place 0.125 mg under the tongue every 4 (four) hours as needed (reflux).     . metoprolol succinate (TOPROL-XL) 50 MG 24 hr tablet Take 1 tablet (50 mg total) by mouth daily. Take with or immediately following a meal. 90 tablet 3  . pravastatin (PRAVACHOL) 40 MG tablet Take 1 tablet by mouth every other day  12   No current facility-administered medications for this visit.     Physical Exam: BP 129/77 mmHg  Pulse 81  Resp 16  Ht 5' 11.75" (1.822 m)  Wt 217 lb (98.431 kg)  BMI 29.65 kg/m2  SpO2 98% He looks well Cardiac exam shows a regular rate and rhythm with tachycardia and no murmur Lungs are clear  Diagnostic Tests:  CLINICAL DATA: Thoracic aortic aneurysm without rupture.  EXAM: CT ANGIOGRAPHY CHEST WITH CONTRAST  TECHNIQUE: Multidetector CT imaging of the chest was performed using the standard protocol during bolus administration of intravenous contrast. Multiplanar CT image reconstructions and MIPs were obtained to evaluate the vascular anatomy.  CONTRAST: 75 mL of Isovue 370 intravenously.  COMPARISON: CT scan of December 19, 2014.  FINDINGS: Ectasia of the sinus of Valsalva is noted which  currently measures 4.5 cm which is not significantly changed compared to prior exam. There is aneurysmal dilatation of the ascending thoracic aorta with maximum measured diameter of 4.0 cm which is slightly enlarged compared to prior exam. Descending thoracic aorta is within normal limits measuring 2.5 cm. Transverse aortic arch is also within normal limits 2.5 cm. No dissection is noted. Great vessels are widely patent without significant stenosis.  No pneumothorax or pleural effusion is noted. No acute pulmonary disease is noted. Visualized portion of upper abdomen is unremarkable. No mediastinal mass or adenopathy is noted. Thyroid gland appears normal. No significant osseous abnormality is noted.  Review of the MIP images confirms the above findings.  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 Recommend annual imaging followup by CTA or MRA. This recommendation follows 2010 ACCF/AHA/AATS/ACR/ASA/SCA/SCAI/SIR/STS/SVM Guidelines for the Diagnosis and Management of Patients with Thoracic Aortic Disease. Circulation. 2010; 121: F621-H086.   Electronically Signed  By: Lupita Raider, M.D.  On: 12/27/2015 10:48  Impression:  The aortic root is essentially unchanged and the proximal ascending aorta is minimally enlarged to 4.0 cm. I have recommending good blood pressure control and follow up scan in one year. He is on a beta blocker.  Plan:  Return in one year with  CTA of the chest.   Alleen Borne, MD [facility] [phone]

## 2016-02-11 ENCOUNTER — Telehealth (HOSPITAL_COMMUNITY): Payer: Self-pay | Admitting: *Deleted

## 2016-02-11 NOTE — Telephone Encounter (Signed)
Discussed with Dr Ladona Ridgelaylor and returned call to patient.  As the episodes are less frequent and intense will have him keep a log and call back in several weeks to report.  Dr Ladona Ridgelaylor does not wish to make any changes at present.

## 2016-02-11 NOTE — Telephone Encounter (Signed)
Patient called in stating he could not get through on main [facility] number but wanted to let Dr. Ladona Ridgelaylor know he has started having increased frequency (mainly at night) of short burst of rapid heart rate. The duration is much shorter than in the past lasting only a few minutes now. He states Dr. Ladona Ridgelaylor told him to call back if he started to notice breakthrough on the beta blocker and this is what prompted him to call in. He can be reached at [phone].

## 2016-10-16 ENCOUNTER — Other Ambulatory Visit: Payer: Self-pay | Admitting: Internal Medicine

## 2016-10-16 DIAGNOSIS — I712 Thoracic aortic aneurysm, without rupture, unspecified: Secondary | ICD-10-CM

## 2016-11-15 ENCOUNTER — Other Ambulatory Visit: Payer: Self-pay | Admitting: Internal Medicine

## 2016-11-15 DIAGNOSIS — I712 Thoracic aortic aneurysm, without rupture, unspecified: Secondary | ICD-10-CM

## 2016-12-02 ENCOUNTER — Other Ambulatory Visit: Payer: Self-pay | Admitting: *Deleted

## 2016-12-02 ENCOUNTER — Telehealth: Payer: Self-pay | Admitting: Internal Medicine

## 2016-12-02 DIAGNOSIS — I712 Thoracic aortic aneurysm, without rupture, unspecified: Secondary | ICD-10-CM

## 2016-12-02 MED ORDER — METOPROLOL SUCCINATE ER 50 MG PO TB24: 50.0000 mg | ORAL_TABLET | Freq: Every day | ORAL | 0 refills | Status: DC

## 2016-12-02 NOTE — Telephone Encounter (Signed)
°*  STAT* If patient is at the pharmacy, call can be transferred to refill team.   1. Which medications need to be refilled? (please list name of each medication and dose if known) Metoprolol 50mg    2. Which pharmacy/location (including street and city if local pharmacy) is medication to be sent to?[facility] on [address] *  3. Do they need a 30 day or 90 day supply? 30

## 2016-12-25 ENCOUNTER — Other Ambulatory Visit: Payer: Self-pay | Admitting: Surgery

## 2016-12-25 DIAGNOSIS — I712 Thoracic aortic aneurysm, without rupture: Secondary | ICD-10-CM

## 2016-12-25 DIAGNOSIS — I7121 Aneurysm of the ascending aorta, without rupture: Secondary | ICD-10-CM

## 2016-12-30 ENCOUNTER — Encounter: Payer: Self-pay | Admitting: Internal Medicine

## 2016-12-30 ENCOUNTER — Ambulatory Visit (INDEPENDENT_AMBULATORY_CARE_PROVIDER_SITE_OTHER): Payer: BC Managed Care – PPO | Admitting: Internal Medicine

## 2016-12-30 DIAGNOSIS — I712 Thoracic aortic aneurysm, without rupture, unspecified: Secondary | ICD-10-CM

## 2016-12-30 MED ORDER — METOPROLOL SUCCINATE ER 50 MG PO TB24: ORAL_TABLET | ORAL | 3 refills | Status: DC

## 2016-12-30 NOTE — Patient Instructions (Signed)

## 2016-12-30 NOTE — Progress Notes (Signed)
      HPI [name] returns today for ongoing evaluation of SVT. 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 Since then he has done well. He thinks that since I saw him last, he has had no heart racing.  Allergies  Allergen Reactions  . Simvastatin Other (See Comments)    Joint aches  . Ampicillin Rash     Current Outpatient Prescriptions  Medication Sig Dispense Refill  . aspirin 81 MG tablet Take 81 mg by mouth daily.    . hyoscyamine (ANASPAZ) 0.125 MG TBDP disintergrating tablet Place 0.125 mg under the tongue every 4 (four) hours as needed (reflux).     . metoprolol succinate (TOPROL-XL) 50 MG 24 hr tablet Take 1 tablet (50 mg total) by mouth daily. Take with or immediately following a meal.*Please keep 12/30/16 appointment for further refills* 30 tablet 0  . pravastatin (PRAVACHOL) 40 MG tablet Take 1 tablet by mouth every other day  12   No current facility-administered medications for this visit.      Past Medical History:  Diagnosis Date  . Aortic insufficiency   . Hyperlipidemia   . IBS (irritable bowel syndrome)   . Tachycardia, paroxysmal (HCC)     ROS:   All systems reviewed and negative except as noted in the HPI.   No past surgical history on file.   Family History  Problem Relation Age of Onset  . Heart attack Neg Hx   . Stroke Neg Hx   . Hypertension Mother      Social History   Social History  . Marital status: Married    Spouse name: N/A  . Number of children: 2  . Years of education: N/A   Occupational History  . school principal    Social History Main Topics  . Smoking status: Never Smoker  . Smokeless tobacco: Never Used  . Alcohol use No  . Drug use: No  . Sexual activity: Not on file   Other Topics Concern  . Not on file   Social History Narrative  . No  narrative on file     BP (!) 130/92   Pulse 72   Ht 6' (1.829 m)   Wt 230 lb 3.2 oz (104.4 kg)   BMI 31.22 kg/m   Physical Exam:  Well appearing middle aged man, NAD HEENT: Unremarkable Neck:  6 cm JVD, no thyromegally Lymphatics:  No adenopathy Back:  No CVA tenderness Lungs:  Clear with no wheezes HEART:  Regular rate rhythm, no murmurs, no rubs, no clicks Abd:  soft, positive bowel sounds, no organomegally, no rebound, no guarding Ext:  2 plus pulses, no edema, no cyanosis, no clubbing Skin:  No rashes no nodules Neuro:  CN II through XII intact, motor grossly intact  EKG - nsr with no pre-excitation.    Assess/Plan: 1. SVT - his symptoms are well controlled on beta blocker therapy.  2. HTN - his blood pressure is a little high today. He notes that it is better when he is not in the doctor's office. He is encouraged to continue his current meds and maintain a low sodium diet. 3. Thoracic aneurysm - he is being followed by Dr. Laneta SimmersBartle. Last CT scan results were noted.  Leonia ReevesGregg Kezia Benevides,M.D.

## 2017-01-14 ENCOUNTER — Other Ambulatory Visit: Payer: Self-pay | Admitting: Surgery

## 2017-01-20 ENCOUNTER — Encounter: Payer: Self-pay | Admitting: Surgery

## 2017-01-20 ENCOUNTER — Ambulatory Visit
Admission: RE | Admit: 2017-01-20 | Discharge: 2017-01-20 | Disposition: A | Discharge status: Home / Self Care | Payer: BC Managed Care – PPO | Source: Ambulatory Visit | Attending: Surgery | Admitting: Surgery

## 2017-01-20 ENCOUNTER — Ambulatory Visit (INDEPENDENT_AMBULATORY_CARE_PROVIDER_SITE_OTHER): Payer: BC Managed Care – PPO | Admitting: Surgery

## 2017-01-20 VITALS — BP 130/85 | HR 76 | Resp 20 | Ht 72.0 in | Wt 230.0 lb

## 2017-01-20 DIAGNOSIS — I712 Thoracic aortic aneurysm, without rupture, unspecified: Secondary | ICD-10-CM

## 2017-01-20 DIAGNOSIS — I7121 Aneurysm of the ascending aorta, without rupture: Secondary | ICD-10-CM

## 2017-01-20 MED ORDER — IOPAMIDOL (ISOVUE-370) INJECTION 76%: 75.0000 mL | Freq: Once | INTRAVENOUS | Status: DC | PRN

## 2017-01-21 ENCOUNTER — Encounter: Payer: Self-pay | Admitting: Surgery

## 2017-01-21 NOTE — Progress Notes (Signed)
HPI:  Robert Madden returns for follow up of an enlarged aortic root. I saw him on 12/19/2014 and his aortic root was 47 mm at the level of the sinuses at that time, up from 45 mm the year before.  The proximal ascending aorta was 39 mm, up from 35 mm the year before. His aortic valve was noted to be tricuspid on echo from 10/05/2013. I last saw him on 01/03/2016 and CT showed the aortic root at the sinus level to be 45 mm with a 4.0 cm fusiform ascending aortic aneurysm. Robert Madden has felt well over the past year with no chest or back pain and no shortness of breath.   Current Outpatient Prescriptions  Medication Sig Dispense Refill  . aspirin 81 MG tablet Take 81 mg by mouth daily.    . hyoscyamine (ANASPAZ) 0.125 MG TBDP disintergrating tablet Place 0.125 mg under the tongue every 4 (four) hours as needed (reflux).     . metoprolol succinate (TOPROL-XL) 50 MG 24 hr tablet Take with or immediately following a meal. 90 tablet 3  . pravastatin (PRAVACHOL) 40 MG tablet takes 40 mg every day  12   No current facility-administered medications for this visit.      Physical Exam: BP 130/85   Pulse 76   Resp 20   Ht 6' (1.829 m)   Wt 230 lb (104.3 kg)   SpO2 97% Comment: RA  BMI 31.19 kg/m  Robert Madden looks well Cardiac exam shows a regular rate and rhythm with tachycardia and no murmur Lungs are clear  Diagnostic Tests:  CLINICAL DATA:  56 year old male with a history of aneurysm follow-up thoracic aneurysm.  Transthoracic ECHO 09/09/2015 reports ascending aorta measuring 38 mm and aortic root measuring 43 mm.  EXAM: CT ANGIOGRAPHY CHEST WITH CONTRAST  TECHNIQUE: Multidetector CT imaging of the chest was performed using the standard protocol during bolus administration of intravenous contrast. Multiplanar CT image reconstructions and MIPs were obtained to evaluate the vascular anatomy.  CONTRAST:  75 cc Isovue 370  COMPARISON:  Multiple prior CT, most recent 12/27/2015 and most remote  12/01/2013  FINDINGS: Cardiovascular:  Heart:  No cardiomegaly. No pericardial fluid/thickening. No significant coronary calcifications.  Aorta:  Relatively unchanged appearance of thoracic aorta, with the greatest diameter of ascending thoracic aorta measured orthogonal to the flow channel (image 54 series 3) approximately 3.8 cm.  Estimated diameter of the sino-tubular junction on coronal images, approximately 3.7 cm. Estimated diameter of the annulus 3.0 cm on coronal images.  No dissection.  No significant atherosclerotic changes.  Four branch vessels with separate origin of the left vertebral artery. Branch vessels widely patent.  Pulmonary arteries:  Study not optimized for evaluation of pulmonary arteries, however, no central, lobar, or proximal segmental filling defects. Main pulmonary artery not enlarged.  Mediastinum/Nodes: Mediastinal lymph nodes are present, none of which are enlarged by CT size criteria. Unremarkable appearance of the thoracic esophagus.  Unremarkable appearance of the thoracic inlet and thyroid.  Lungs/Pleura: Central airways are clear. No pleural effusion. No confluent left-sided airspace disease.  No pneumothorax.  Upper Abdomen: Hiatal hernia  Musculoskeletal: No displaced fracture. Degenerative changes of the spine.  Review of the MIP images confirms the above findings.  IMPRESSION: Similar appearance of ascending aorta to the comparison CT. Measurements on the current CT estimated 3.8 cm of the ascending aorta beyond the sino-tubular junction (axial images), sino-tubular junction 3.7 cm (coronal images), and the annulus 3.0 cm (coronal images).  Signed,  Marijean Niemann  Kenna GilbertS. Wagner, DO  [facility]  [facility]   Electronically Signed   By: Gilmer MorJaime  Wagner D.O.   On: 01/20/2017 14:37  Impression:  The aortic root and ascending aortic aneurysm are  unchanged from a year ago and stable dating back to 11/2013. His BP is under adequate control and Robert Madden is on a beta blocker. I reviewed the CT images with him and answered his questions. I think this can be followed up in 2 years.  Plan:  Return in 2 years with CTA chest.   Robert BorneBryan K Khloei Spiker, MD [facility] [phone]

## 2017-03-16 IMAGING — CT CT ANGIO CHEST
2 of 5 series · 9 of 30 positions shown · IV contrast (75CC ISOVUE 370)
Comparison: CT scan of December 19, 2014.

CLINICAL DATA: Thoracic aortic aneurysm without rupture.

EXAM:
CT ANGIOGRAPHY CHEST WITH CONTRAST
TECHNIQUE: Multidetector CT imaging of the chest was performed using the
standard protocol during bolus administration of intravenous
contrast. Multiplanar CT image reconstructions and MIPs were
obtained to evaluate the vascular anatomy.
CONTRAST:  75 mL of Isovue 370 intravenously.

[Series 4: angio · axial · 0.74mm/px · z∈[-266,-54]mm · 4 of 143 slices shown]
[im 29/143  lung]
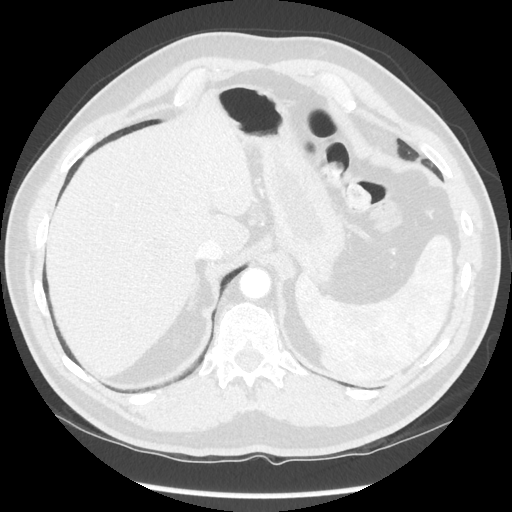
[im 57/143  mediastinal]
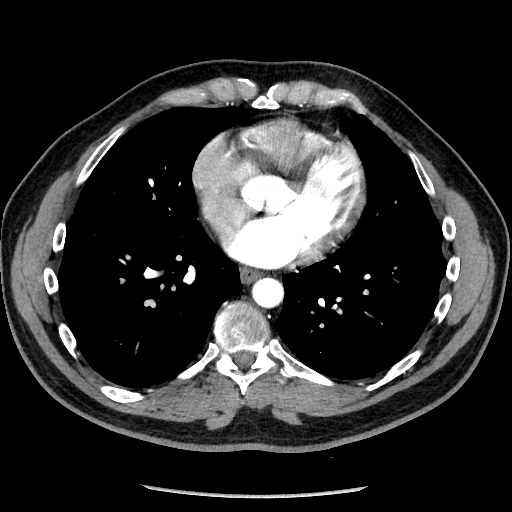
[im 86/143  lung]
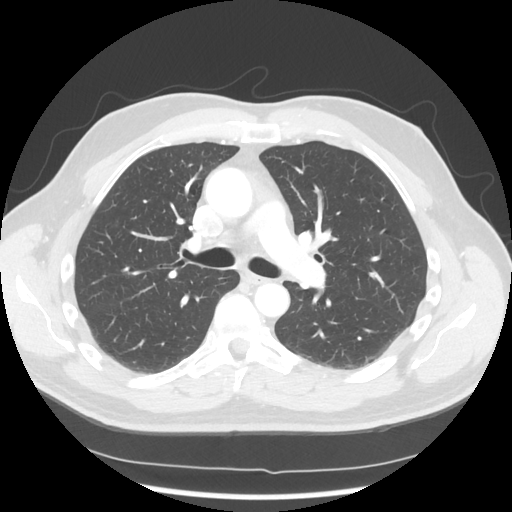
[im 114/143  mediastinal]
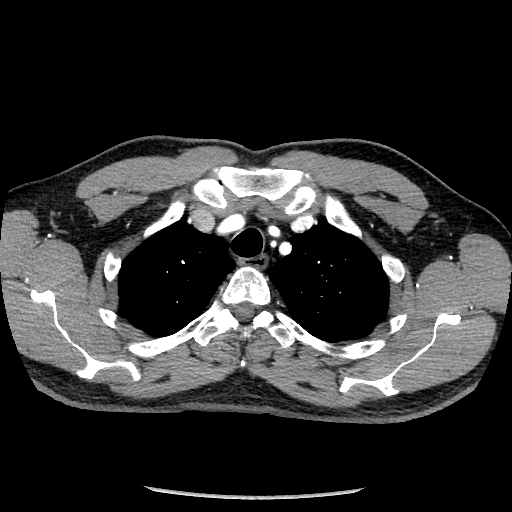

[Series 602: sagittal body · sagittal · 0.74mm/px · 5 of 152 slices shown]
[im 26/152  lung]
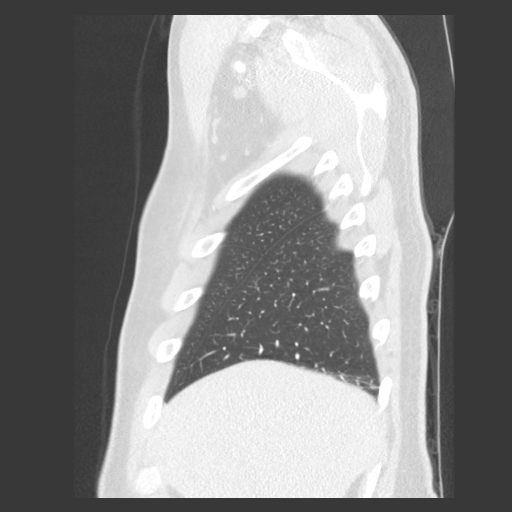
[im 51/152  lung]
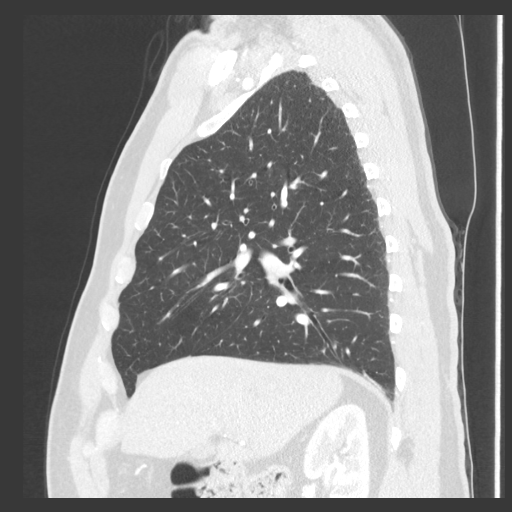
[im 76/152  lung]
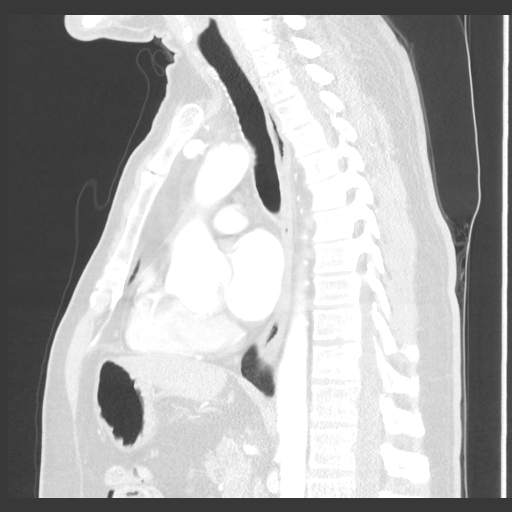
[im 101/152  lung]
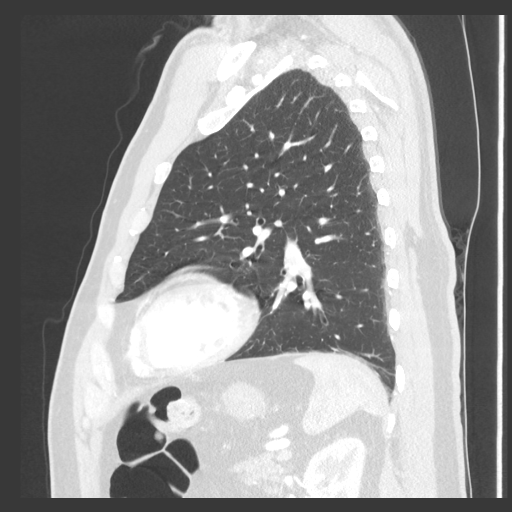
[im 126/152  lung]
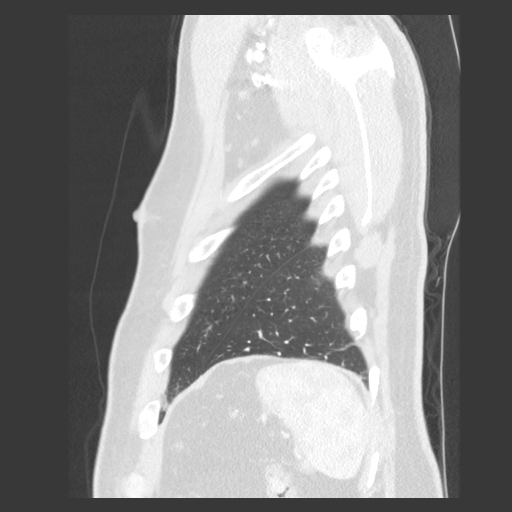

[9 of 30 positions shown; findings below may reference images not displayed]

FINDINGS: Ectasia of the sinus of Valsalva is noted which currently measures
4.5 cm which is not significantly changed compared to prior exam.
There is aneurysmal dilatation of the ascending thoracic aorta with
maximum measured diameter of 4.0 cm which is slightly enlarged
compared to prior exam. Descending thoracic aorta is within normal
limits measuring 2.5 cm. Transverse aortic arch is also within
normal limits 2.5 cm. No dissection is noted. Great vessels are
widely patent without significant stenosis.

No pneumothorax or pleural effusion is noted. No acute pulmonary
disease is noted. Visualized portion of upper abdomen is
unremarkable. No mediastinal mass or adenopathy is noted. Thyroid
gland appears normal. No significant osseous abnormality is noted.

Review of the MIP images confirms the above findings.
IMPRESSION: Grossly stable ectasia of the sinus of Valsalva is noted which
currently measures 4.5 cm.

4.0 cm aneurysmal dilatation of proximal ascending thoracic aorta is
noted which is slightly enlarged compared to prior exam. Recommend
annual imaging followup by CTA or MRA. This recommendation follows
5727 ACCF/AHA/AATS/ACR/ASA/SCA/JAVIER/QUIRIJN/JANNECHE/KIRILL Guidelines for the
Diagnosis and Management of Patients with Thoracic Aortic Disease.
Circulation. 5727; 121: e266-e369.

## 2018-01-02 ENCOUNTER — Other Ambulatory Visit: Payer: Self-pay | Admitting: Internal Medicine

## 2018-01-02 DIAGNOSIS — I712 Thoracic aortic aneurysm, without rupture, unspecified: Secondary | ICD-10-CM

## 2018-04-07 ENCOUNTER — Other Ambulatory Visit: Payer: Self-pay | Admitting: Internal Medicine

## 2018-04-07 DIAGNOSIS — I712 Thoracic aortic aneurysm, without rupture, unspecified: Secondary | ICD-10-CM

## 2018-04-07 NOTE — Telephone Encounter (Signed)
Outpatient Medication Detail    Disp Refills Start End   metoprolol succinate (TOPROL-XL) 50 MG 24 hr tablet 30 tablet 0 04/07/2018    Sig: TAKE 1 TABLET BY MOUTH EVERY DAY WITH OR IMMEDIATELY FOLLOWING A MEAL. Please make overdue appt for future refills. 2nd attempt Thank You>   Sent to pharmacy as: metoprolol succinate (TOPROL-XL) 50 MG 24 hr tablet   Notes to Pharmacy: Pt must make overdue appt for future refills 2nd attempt Thank You.   E-Prescribing Status: Receipt confirmed by pharmacy (04/07/2018 2:31 PM EDT)   Associated Diagnoses   Thoracic aortic aneurysm without rupture [facility])     Pharmacy   [id number] - [place], [place] - [address] AT [facility] OF [address]

## 2018-05-05 ENCOUNTER — Telehealth: Payer: Self-pay | Admitting: Internal Medicine

## 2018-05-05 DIAGNOSIS — I712 Thoracic aortic aneurysm, without rupture, unspecified: Secondary | ICD-10-CM

## 2018-05-05 MED ORDER — METOPROLOL SUCCINATE ER 50 MG PO TB24: ORAL_TABLET | ORAL | 2 refills | Status: DC

## 2018-05-05 NOTE — Telephone Encounter (Signed)
Ok for Pt to be seen q 2 years.  Refills sent to pharmacy.

## 2018-05-05 NOTE — Telephone Encounter (Signed)
Pt calling requesting a refill on metoprolol 50 mg tablet. Pt's LOV was 12/02/2016. Pt stated that Dr. Ladona Ridgelaylor stated that he did not need to be seen for 2 years. This information is not in the LOV note. Pt stated if he needs to come in, he would, but he needs a refill on his medication. Pt would like a call back concerning this matter. Please address

## 2018-11-24 ENCOUNTER — Encounter: Payer: Self-pay | Admitting: Internal Medicine

## 2018-12-07 ENCOUNTER — Other Ambulatory Visit: Payer: Self-pay | Admitting: Surgery

## 2018-12-07 DIAGNOSIS — I712 Thoracic aortic aneurysm, without rupture, unspecified: Secondary | ICD-10-CM

## 2018-12-21 ENCOUNTER — Encounter: Payer: Self-pay | Admitting: Internal Medicine

## 2018-12-21 ENCOUNTER — Ambulatory Visit: Payer: BC Managed Care – PPO | Admitting: Internal Medicine

## 2018-12-21 VITALS — BP 124/86 | HR 68 | Ht 72.0 in | Wt 236.0 lb

## 2018-12-21 DIAGNOSIS — I712 Thoracic aortic aneurysm, without rupture, unspecified: Secondary | ICD-10-CM

## 2018-12-21 DIAGNOSIS — I471 Supraventricular tachycardia: Secondary | ICD-10-CM | POA: Diagnosis not present

## 2018-12-21 MED ORDER — METOPROLOL SUCCINATE ER 50 MG PO TB24: ORAL_TABLET | ORAL | 6 refills | Status: DC

## 2018-12-21 NOTE — Progress Notes (Signed)
HPI [name] returns today for ongoing evaluation of SVT. 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 Since then he has done well. He thinks that since I saw him last, he has had no heart racing. he is pending followup CT scan and a visit with Dr. Laneta SimmersBartle. He has not been checking his blood pressure.  Allergies    Allergies  Allergen Reactions  . Simvastatin Other (See Comments)    Joint aches  . Ampicillin Rash     Current Outpatient Medications  Medication Sig Dispense Refill  . aspirin 81 MG tablet Take 81 mg by mouth daily.    . hyoscyamine (ANASPAZ) 0.125 MG TBDP disintergrating tablet Place 0.125 mg under the tongue every 4 (four) hours as needed (reflux).     . metoprolol succinate (TOPROL-XL) 50 MG 24 hr tablet TAKE 1 TABLET BY MOUTH EVERY DAY WITH OR IMMEDIATELY FOLLOWING A MEAL. Please make overdue appt for future refills. 2nd attempt Thank You> 90 tablet 2  . pravastatin (PRAVACHOL) 40 MG tablet takes 40 mg every day  12   No current facility-administered medications for this visit.      Past Medical History:  Diagnosis Date  . Aortic insufficiency   . Hyperlipidemia   . IBS (irritable bowel syndrome)   . Tachycardia, paroxysmal (HCC)     ROS:   All systems reviewed and negative except as noted in the HPI.   History reviewed. No pertinent surgical history.   Family History  Problem Relation Age of Onset  . Hypertension Mother   . Heart attack Neg Hx   . Stroke Neg Hx      Social History   Socioeconomic History  . Marital status: Married    Spouse name: Not on file  . Number of children: 2  . Years of education: Not on file  . Highest education level: Not on file  Occupational History  . Occupation: school principal  Social Needs  . Financial resource strain: Not on file    . Food insecurity:    Worry: Not on file    Inability: Not on file  . Transportation needs:    Medical: Not on file    Non-medical: Not on file  Tobacco Use  . Smoking status: Never Smoker  . Smokeless tobacco: Never Used  Substance and Sexual Activity  . Alcohol use: No  . Drug use: No  . Sexual activity: Not on file  Lifestyle  . Physical activity:    Days per week: Not on file    Minutes per session: Not on file  . Stress: Not on file  Relationships  . Social connections:    Talks on phone: Not on file    Gets together: Not on file    Attends religious service: Not on file    Active member of club or organization: Not on file    Attends meetings of clubs or organizations: Not on file    Relationship status: Not on file  . Intimate partner violence:    Fear of current or ex partner: Not on file    Emotionally abused: Not on file    Physically abused: Not on file    Forced sexual activity: Not on file  Other Topics Concern  . Not on file  Social History Narrative  .  Not on file     BP 124/86   Pulse 68   Ht 6' (1.829 m)   Wt 236 lb (107 kg)   SpO2 96%   BMI 32.01 kg/m   Physical Exam:  Well appearing middle aged man, NAD HEENT: Unremarkable Neck:  6 cm JVD, no thyromegally Lymphatics:  No adenopathy Back:  No CVA tenderness Lungs:  Clear with no wheezes HEART:  Regular rate rhythm, no murmurs, no rubs, no clicks Abd:  soft, positive bowel sounds, no organomegally, no rebound, no guarding Ext:  2 plus pulses, no edema, no cyanosis, no clubbing Skin:  No rashes no nodules Neuro:  CN II through XII intact, motor grossly intact  EKG - NSR with no pre-excitation   Assess/Plan: 1. SVT - he is well controlled on his beta blocker.  2. HTN - his diastolic bp is up a bit. He does not check his bp regularly but is willing and I have encouraged him to obtain a bp cuff and check once a week. He will maintain a low sodium diet. 3. Thoracic aneurysm - he was seen  almost 2 years ago and is pending repeat CT scan and followup with Dr. Laneta Madden next month. 4. Dyslipidemia - he will continue statin therapy. No muscle aches.  Leonia Reeves.D.

## 2018-12-21 NOTE — Patient Instructions (Addendum)
Medication Instructions:  Your physician recommends that you continue on your current medications as directed. Please refer to the Current Medication list given to you today.  Take your blood pressure once a week.  Labwork: None ordered.  Testing/Procedures: None ordered.  Follow-Up: Your physician wants you to follow-up in: 2 years with Dr. Ladona Ridgel.   You will receive a reminder letter in the mail two months in advance. If you don't receive a letter, please call our office to schedule the follow-up appointment.  Any Other Special Instructions Will Be Listed Below (If Applicable).  If you need a refill on your cardiac medications before your next appointment, please call your pharmacy.

## 2019-01-10 ENCOUNTER — Ambulatory Visit: Payer: BC Managed Care – PPO | Admitting: Internal Medicine

## 2019-01-25 ENCOUNTER — Encounter: Payer: Self-pay | Admitting: Surgery

## 2019-01-25 ENCOUNTER — Ambulatory Visit
Admission: RE | Admit: 2019-01-25 | Discharge: 2019-01-25 | Disposition: A | Discharge status: Home / Self Care | Payer: BC Managed Care – PPO | Source: Ambulatory Visit | Attending: Surgery | Admitting: Surgery

## 2019-01-25 ENCOUNTER — Other Ambulatory Visit: Payer: Self-pay

## 2019-01-25 ENCOUNTER — Ambulatory Visit: Payer: BC Managed Care – PPO | Admitting: Surgery

## 2019-01-25 VITALS — BP 133/92 | HR 67 | Resp 18 | Ht 72.0 in | Wt 230.0 lb

## 2019-01-25 DIAGNOSIS — I712 Thoracic aortic aneurysm, without rupture, unspecified: Secondary | ICD-10-CM

## 2019-01-25 MED ORDER — IOPAMIDOL (ISOVUE-370) INJECTION 76%
75.0000 mL | Freq: Once | INTRAVENOUS | Status: AC | PRN
  Administered 2019-01-25: 75 mL via INTRAVENOUS

## 2019-01-25 NOTE — Progress Notes (Signed)
HPI:  The patient returns today for follow-up of an enlarged aortic root.  I initially saw him in 2015 and his aortic root at the sinus level measured 45 mm.  In 2016 it measured 47 mm.  I last saw him 2 years ago and the measurement was 4.5 cm.  He is continued to feel well without chest pain or shortness of breath.  He has been on a beta-blocker.  Current Outpatient Medications  Medication Sig Dispense Refill  . hyoscyamine (ANASPAZ) 0.125 MG TBDP disintergrating tablet Place 0.125 mg under the tongue every 4 (four) hours as needed (reflux).     . metoprolol succinate (TOPROL-XL) 50 MG 24 hr tablet TAKE 1 TABLET BY MOUTH EVERY DAY WITH OR IMMEDIATELY FOLLOWING A MEAL. 90 tablet 6  . pravastatin (PRAVACHOL) 40 MG tablet takes 40 mg every day  12   No current facility-administered medications for this visit.      Physical Exam: BP (!) 133/92 (BP Location: Right Arm, Patient Position: Sitting, Cuff Size: Large)   Pulse 67   Resp 18   Ht 6' (1.829 m)   Wt 230 lb (104.3 kg)   SpO2 96% Comment: RA  BMI 31.19 kg/m  He looks well. Cardiac exam shows a regular rate and rhythm with normal heart sounds.  There is no murmur. Lungs are clear.  Diagnostic Tests:  CLINICAL DATA:  F/u TAA no complaints No surg No hx ca Non smoker^  EXAM: CT ANGIOGRAPHY CHEST WITH CONTRAST  TECHNIQUE: Multidetector CT imaging of the chest was performed using the standard protocol during bolus administration of intravenous contrast. Multiplanar CT image reconstructions and MIPs were obtained to evaluate the vascular anatomy.  CONTRAST:  43mL ISOVUE-370 IOPAMIDOL (ISOVUE-370) INJECTION 76%  COMPARISON:  01/20/2017  FINDINGS: Cardiovascular: Heart size normal. No pericardial effusion. Fair contrast opacification of the pulmonary arterial tree; the exam was not optimized for detection of pulmonary emboli. Good contrast opacification of the thoracic aorta with maximum transverse dimensions as  follows:  4.6 cm sinuses of Valsalva, stable since previous  4 cm sino-tubular junction  3.9 cm mid ascending  3.3 cm distal ascending/proximal arch  2.4 cm distal arch  2.8 cm proximal descending  2.3 cm distal descending  No dissection or stenosis. Patent brachiocephalic arterial origin anatomy. The left vertebral artery arises directly from the arch, an anatomic variant.  Mediastinum/Nodes: No hilar or mediastinal adenopathy.  Lungs/Pleura: No pleural effusion. No pneumothorax. Pleural-based ground-glass opacities posteriorly in both lung bases, favor hypoinflation over alveolar disease. Lungs otherwise clear.  Upper Abdomen: No acute findings.  Musculoskeletal: No chest wall abnormality. No acute or significant osseous findings.  Review of the MIP images confirms the above findings.  IMPRESSION: 1. Stable 4 cm ascending thoracic aortic aneurysm without complicating features. Recommend annual imaging followup by CTA or MRA. This recommendation follows 2010 ACCF/AHA/AATS/ACR/ASA/SCA/SCAI/SIR/STS/SVM Guidelines for the Diagnosis and Management of Patients with Thoracic Aortic Disease. Circulation. 2010; 121: G536-I680   Electronically Signed   By: Corlis Leak M.D.   On: 01/25/2019 09:45   Impression:  He has stable mild aneurysmal dilatation of the aortic root and ascending aorta with a maximum diameter at the sinus level 4.6 cm which is minimally changed from 2 years ago.  The diameter of the sinotubular junction is 4.0 cm which is unchanged.  The mid ascending aorta measures 3.9 cm which is unchanged.  His blood pressure is under adequate control and he is on a beta-blocker.  I reviewed the  CTA images with him and answered his questions.  I stressed the importance of continued good blood pressure control and preventing further enlargement and aortic dissection.  His aorta has been fairly stable in size and I think be fine to follow this up in 2  years.  Plan:  He will return to see me in 2 years with a CTA of the chest.  I spent 15 minutes performing this established patient evaluation and > 50% of this time was spent face to face counseling and coordinating the care of this patient's aortic aneurysm.    Alleen Borne, MD [facility] [phone]

## 2019-02-01 ENCOUNTER — Other Ambulatory Visit: Payer: Self-pay | Admitting: Internal Medicine

## 2019-02-01 DIAGNOSIS — I712 Thoracic aortic aneurysm, without rupture, unspecified: Secondary | ICD-10-CM

## 2020-02-27 ENCOUNTER — Other Ambulatory Visit: Payer: Self-pay | Admitting: Internal Medicine

## 2020-02-27 DIAGNOSIS — I712 Thoracic aortic aneurysm, without rupture, unspecified: Secondary | ICD-10-CM

## 2020-03-01 ENCOUNTER — Other Ambulatory Visit: Payer: Self-pay

## 2020-03-01 DIAGNOSIS — I712 Thoracic aortic aneurysm, without rupture, unspecified: Secondary | ICD-10-CM

## 2020-03-01 MED ORDER — METOPROLOL SUCCINATE ER 50 MG PO TB24: ORAL_TABLET | ORAL | 3 refills | Status: DC

## 2020-10-22 ENCOUNTER — Other Ambulatory Visit: Payer: Self-pay | Admitting: Surgery

## 2020-10-22 DIAGNOSIS — I712 Thoracic aortic aneurysm, without rupture, unspecified: Secondary | ICD-10-CM

## 2020-10-30 ENCOUNTER — Encounter: Payer: Self-pay | Admitting: Internal Medicine

## 2020-10-30 ENCOUNTER — Other Ambulatory Visit: Payer: Self-pay

## 2020-10-30 ENCOUNTER — Ambulatory Visit: Payer: BC Managed Care – PPO | Admitting: Internal Medicine

## 2020-10-30 VITALS — BP 136/82 | HR 65 | Ht 72.0 in | Wt 229.4 lb

## 2020-10-30 DIAGNOSIS — I471 Supraventricular tachycardia: Secondary | ICD-10-CM | POA: Diagnosis not present

## 2020-10-30 NOTE — Progress Notes (Signed)
HPI Robert Madden returns today after an almost 2 year absence from our EP clinic. He has a h/o thoracic aneurysm and has been followed by Dr. Laneta Simmers. He has a h/o palpitations and documented PJRT, which terminated with carotid massage. He has been on beta blocker therapy. He also has HTN. In the interim, he notes that he has had no episodes of heart racing. He admits to some dietary indiscretion and has gained weight. He is pending a visit with Dr. Laneta Simmers for ongoing evaluation of a thoracic aneurysm. Allergies  Allergen Reactions  . Simvastatin Other (See Comments)    Joint aches  . Ampicillin Rash     Current Outpatient Medications  Medication Sig Dispense Refill  . hyoscyamine (ANASPAZ) 0.125 MG TBDP disintergrating tablet Place 0.125 mg under the tongue every 4 (four) hours as needed (reflux).     . metoprolol succinate (TOPROL-XL) 50 MG 24 hr tablet TAKE 1 TABLET BY MOUTH EVERY DAY WITH OR IMMEDIATELY FOLLOWING A MEAL. 90 tablet 3  . pravastatin (PRAVACHOL) 40 MG tablet takes 40 mg every day  12   No current facility-administered medications for this visit.     Past Medical History:  Diagnosis Date  . Aortic insufficiency   . Hyperlipidemia   . IBS (irritable bowel syndrome)   . Tachycardia, paroxysmal (HCC)     ROS:   All systems reviewed and negative except as noted in the HPI.   History reviewed. No pertinent surgical history.   Family History  Problem Relation Age of Onset  . Hypertension Mother   . Heart attack Neg Hx   . Stroke Neg Hx      Social History   Socioeconomic History  . Marital status: Married    Spouse name: Not on file  . Number of children: 2  . Years of education: Not on file  . Highest education level: Not on file  Occupational History  . Occupation: school principal  Tobacco Use  . Smoking status: Never Smoker  . Smokeless tobacco: Never Used  Vaping Use  . Vaping Use: Never used  Substance and Sexual Activity  . Alcohol  use: No  . Drug use: No  . Sexual activity: Not on file  Other Topics Concern  . Not on file  Social History Narrative  . Not on file   Social Determinants of Health   Financial Resource Strain:   . Difficulty of Paying Living Expenses: Not on file  Food Insecurity:   . Worried About Programme researcher, broadcasting/film/video in the Last Year: Not on file  . Ran Out of Food in the Last Year: Not on file  Transportation Needs:   . Lack of Transportation (Medical): Not on file  . Lack of Transportation (Non-Medical): Not on file  Physical Activity:   . Days of Exercise per Week: Not on file  . Minutes of Exercise per Session: Not on file  Stress:   . Feeling of Stress : Not on file  Social Connections:   . Frequency of Communication with Friends and Family: Not on file  . Frequency of Social Gatherings with Friends and Family: Not on file  . Attends Religious Services: Not on file  . Active Member of Clubs or Organizations: Not on file  . Attends Banker Meetings: Not on file  . Marital Status: Not on file  Intimate Partner Violence:   . Fear of Current or Ex-Partner: Not on file  . Emotionally Abused: Not  on file  . Physically Abused: Not on file  . Sexually Abused: Not on file     BP 136/82   Pulse 65   Ht 6' (1.829 m)   Wt 229 lb 6.4 oz (104.1 kg)   SpO2 92%   BMI 31.11 kg/m   Physical Exam:  Well appearing NAD HEENT: Unremarkable Neck:  No JVD, no thyromegally Lymphatics:  No adenopathy Back:  No CVA tenderness Lungs:  Clear with no wheezes HEART:  Regular rate rhythm, no murmurs, no rubs, no clicks Abd:  soft, positive bowel sounds, no organomegally, no rebound, no guarding Ext:  2 plus pulses, no edema, no cyanosis, no clubbing Skin:  No rashes no nodules Neuro:  CN II through XII intact, motor grossly intact  EKG - nsr   Assess/Plan: 1. SVT - he is asymptomatic. He will continue his beta blocker 2. HTN -his bp is up a bit. I have recommended he avoid salty  food and lose weight. 3. Thoracic aneurysm - he is asymptomatic and pending evaluation by Dr. Laneta Simmers in a few weeks. 4. Obesity - he had lost down to 207 but gained back over 20 lbs. I encouraged him to work on losing more weight.  Robert Gowda Rani Idler,MD

## 2020-10-30 NOTE — Patient Instructions (Addendum)
Medication Instructions:  Your physician recommends that you continue on your current medications as directed. Please refer to the Current Medication list given to you today.  Labwork: None ordered.  Testing/Procedures: None ordered.  Follow-Up: Your physician wants you to follow-up in: as needed with Dr. Taylor.      Any Other Special Instructions Will Be Listed Below (If Applicable).  If you need a refill on your cardiac medications before your next appointment, please call your pharmacy.   

## 2020-11-13 ENCOUNTER — Ambulatory Visit: Payer: BC Managed Care – PPO | Admitting: Surgery

## 2020-11-20 ENCOUNTER — Ambulatory Visit
Admission: RE | Admit: 2020-11-20 | Discharge: 2020-11-20 | Disposition: A | Discharge status: Home / Self Care | Payer: BC Managed Care – PPO | Source: Ambulatory Visit | Attending: Surgery | Admitting: Surgery

## 2020-11-20 ENCOUNTER — Encounter: Payer: Self-pay | Admitting: Surgery

## 2020-11-20 ENCOUNTER — Other Ambulatory Visit: Payer: Self-pay

## 2020-11-20 ENCOUNTER — Ambulatory Visit: Payer: BC Managed Care – PPO | Admitting: Surgery

## 2020-11-20 VITALS — BP 150/90 | HR 70 | Temp 97.6°F | Resp 20 | Ht 72.0 in | Wt 229.0 lb

## 2020-11-20 DIAGNOSIS — I712 Thoracic aortic aneurysm, without rupture, unspecified: Secondary | ICD-10-CM

## 2020-11-20 DIAGNOSIS — K589 Irritable bowel syndrome without diarrhea: Secondary | ICD-10-CM | POA: Insufficient documentation

## 2020-11-20 DIAGNOSIS — I77810 Thoracic aortic ectasia: Secondary | ICD-10-CM | POA: Insufficient documentation

## 2020-11-20 MED ORDER — IOPAMIDOL (ISOVUE-370) INJECTION 76%
75.0000 mL | Freq: Once | INTRAVENOUS | Status: AC | PRN
  Administered 2020-11-20: 75 mL via INTRAVENOUS

## 2020-11-20 NOTE — Progress Notes (Signed)
HPI:  The patient is a 59 year old gentleman who returns today for follow-up of an enlarged aortic root with a sinus diameter of 4.5 to 4.7 cm with a maximum diameter of the mid ascending aorta of 4 cm.  He continues to feel well without chest pain or shortness of breath.  He has been checking his blood pressure at home and is usually under good control.  Current Outpatient Medications  Medication Sig Dispense Refill  . hyoscyamine (ANASPAZ) 0.125 MG TBDP disintergrating tablet Place 0.125 mg under the tongue every 4 (four) hours as needed (reflux).     . metoprolol succinate (TOPROL-XL) 50 MG 24 hr tablet TAKE 1 TABLET BY MOUTH EVERY DAY WITH OR IMMEDIATELY FOLLOWING A MEAL. 90 tablet 3  . pravastatin (PRAVACHOL) 40 MG tablet takes 40 mg every day  12   No current facility-administered medications for this visit.     Physical Exam: BP (!) 150/90   Pulse 70   Temp 97.6 F (36.4 C) (Skin)   Resp 20   Ht 6' (1.829 m)   Wt 229 lb (103.9 kg)   SpO2 99% Comment: RA  BMI 31.06 kg/m  He looks well. Cardiac exam shows regular rate and rhythm normal heart sounds.  There is no murmur. Lungs are clear.   Diagnostic Tests:  Narrative & Impression  CLINICAL DATA:  Thoracic aortic aneurysm.  EXAM: CT ANGIOGRAPHY CHEST WITH CONTRAST  TECHNIQUE: Multidetector CT imaging of the chest was performed using the standard protocol during bolus administration of intravenous contrast. Multiplanar CT image reconstructions and MIPs were obtained to evaluate the vascular anatomy.  CONTRAST:  13mL ISOVUE-370 IOPAMIDOL (ISOVUE-370) INJECTION 76%  COMPARISON:  01/25/2019.  FINDINGS: Note: Patient had intravenous access obtained in the left antecubital fossa for power injection. With injection, patient developed an "ache" in the region of the IV and was subsequently noted by the technical staff to have and extravasation. Approximately 75 cc of contrast was extravasated. On exam,  the patient has a tense, cool left biceps muscle without redness or erythema. Skin is intact. Left grip strength was normal. Normal capillary refill at the nail beds. Strong radial pulse is palpable and the patient reports no numbness or para seizure a in the left upper extremity.  A new IV was started in the right upper extremity and the patient scan was completed. I exam of the patient again after the completion of the study and he was reporting no pain or discomfort in the region of extravasation at that time. I discussed treatment with the patient, advocating rest, elevation, compression as warranted, and ice as necessary. I advised him to be on the look out for skin changes and in the event that he develops skin discoloration or ulceration overlying the area of extravasation to call the number the given to him on the provided written instruction sheet. I also advised that if he had skin changes or other symptoms that he was not comfortable with, presentation to an emergency room would be appropriate.  Mr. Mineer seemed comfortable with a situation and plan.  Cardiovascular: The heart size is normal. No substantial pericardial effusion.  Although not a gated study, measurements obtained on double oblique imaging are as follows:  4.7 cm at sinuses of Valsalva, stable from 4.6 cm previously.  3.9 cm at the sino-tubular junction, stable since 4.0 cm previously.  4.0 cm mid ascending segment, compared to 3.9 cm previously.  3.3 cm distal ascending/proximal arch segment is stable.  2.8 cm proximal descending aortic diameter is stable.  Descending thoracic aorta at the level of the left atrium is 2.4 cm maximum diameter.  Mediastinum/Nodes: No mediastinal lymphadenopathy. There is no hilar lymphadenopathy. The esophagus has normal imaging features. There is no axillary lymphadenopathy.  Lungs/Pleura: No suspicious pulmonary nodule or mass. No  focal consolidation. Compressive atelectasis noted in the dependent lung bases bilaterally. Tiny left lower lobe perifissural nodule on 85/10 is stable, consistent with subpleural lymph node. No pleural effusion.  Upper Abdomen: Unremarkable.  Musculoskeletal: The patient's left upper extremity was included in the field of view to assess location of the extravasated contrast. The contrast material appears to be in the intermuscular compartment posterior to the biceps muscle. No worrisome lytic or sclerotic osseous abnormality.  Review of the MIP images confirms the above findings.  IMPRESSION: 1. Stable stable 4 cm ascending thoracic aortic aneurysm. Recommend annual imaging followup by CTA or MRA. This recommendation follows 2010 ACCF/AHA/AATS/ACR/ASA/SCA/SCAI/SIR/STS/SVM Guidelines for the Diagnosis and Management of Patients with Thoracic Aortic Disease. Circulation. 2010; 121: O350-K938. Aortic aneurysm NOS (ICD10-I71.9) 2. Left upper extremity contrast extravasation during initial power injection. Please see above for full details.   Electronically Signed   By: Kennith Center M.D.   On: 11/20/2020 16:05      Impression:  This 59 year old gentleman has a stable aortic root and ascending aortic aneurysm.  The maximum diameter of that I measure at the sinus level today is between 4.6 and 4.7 cm.  This is unchanged from his previous scans.  The ascending aorta has a maximum diameter of 4.0 cm and is unchanged.  This is still well below the surgical threshold of 5.5 cm.  His last echocardiogram was in 2016 and showed a normal trileaflet aortic valve with mild regurgitation.  I cannot hear a murmur of aortic insufficiency today to suggest any change in that.  I reviewed the CTA study with him and answered his questions.  I recommended that he of follow-up CTA of the chest in 1 year.  I stressed the importance of continued good blood pressure control and preventing further  enlargement and acute aortic dissection.  Plan:  He will return to see me in 1 year with a CTA of the chest.  I spent 20 minutes performing this established patient evaluation and > 50% of this time was spent face to face counseling and coordinating the care of this patient's aortic aneurysm.    Alleen Borne, MD [facility] [phone]

## 2020-11-21 ENCOUNTER — Telehealth: Payer: Self-pay | Admitting: Diagnostic Radiology

## 2021-02-26 ENCOUNTER — Other Ambulatory Visit: Payer: Self-pay | Admitting: Internal Medicine

## 2021-02-26 DIAGNOSIS — I712 Thoracic aortic aneurysm, without rupture, unspecified: Secondary | ICD-10-CM

## 2021-10-13 ENCOUNTER — Other Ambulatory Visit: Payer: Self-pay | Admitting: Surgery

## 2021-10-13 DIAGNOSIS — I7121 Aneurysm of the ascending aorta, without rupture: Secondary | ICD-10-CM

## 2021-11-26 ENCOUNTER — Other Ambulatory Visit: Payer: BC Managed Care – PPO

## 2021-11-26 ENCOUNTER — Ambulatory Visit: Payer: BC Managed Care – PPO | Admitting: Surgery

## 2021-12-09 ENCOUNTER — Other Ambulatory Visit: Payer: Self-pay

## 2021-12-09 DIAGNOSIS — I712 Thoracic aortic aneurysm, without rupture, unspecified: Secondary | ICD-10-CM

## 2021-12-09 MED ORDER — METOPROLOL SUCCINATE ER 50 MG PO TB24: ORAL_TABLET | ORAL | 0 refills | Status: DC

## 2021-12-09 NOTE — Telephone Encounter (Signed)
Pt's medication was sent to pt's pharmacy as requested. Confirmation received.  °

## 2021-12-10 ENCOUNTER — Encounter: Payer: Self-pay | Admitting: Surgery

## 2021-12-10 ENCOUNTER — Other Ambulatory Visit: Payer: Self-pay

## 2021-12-10 ENCOUNTER — Ambulatory Visit: Payer: BC Managed Care – PPO | Admitting: Surgery

## 2021-12-10 ENCOUNTER — Ambulatory Visit
Admission: RE | Admit: 2021-12-10 | Discharge: 2021-12-10 | Disposition: A | Discharge status: Home / Self Care | Payer: BC Managed Care – PPO | Source: Ambulatory Visit | Attending: Surgery | Admitting: Surgery

## 2021-12-10 VITALS — BP 135/87 | HR 66 | Resp 20 | Wt 227.0 lb

## 2021-12-10 DIAGNOSIS — I712 Thoracic aortic aneurysm, without rupture, unspecified: Secondary | ICD-10-CM

## 2021-12-10 DIAGNOSIS — I7121 Aneurysm of the ascending aorta, without rupture: Secondary | ICD-10-CM

## 2021-12-10 MED ORDER — IOPAMIDOL (ISOVUE-370) INJECTION 76%
75.0000 mL | Freq: Once | INTRAVENOUS | Status: AC | PRN
  Administered 2021-12-10: 75 mL via INTRAVENOUS

## 2021-12-10 NOTE — Progress Notes (Signed)
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  Previous echocardiogram in 2016 showed a normal trileaflet aortic valve with mild regurgitation.  He continues to feel well without chest or back pain.  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  Current Outpatient Medications  Medication Sig Dispense Refill   hyoscyamine (ANASPAZ) 0.125 MG TBDP disintergrating tablet Place 0.125 mg under the tongue every 4 (four) hours as needed (reflux).      metoprolol succinate (TOPROL-XL) 50 MG 24 hr tablet Take with or immediately following a meal. Please make overdue appt with Dr Ladona Ridgel before anymore refills. Thank you 1st attempt 30 tablet 0   pravastatin (PRAVACHOL) 40 MG tablet takes 40 mg every day  12   No current facility-administered medications for this visit.     Physical Exam: BP 135/87 (BP Location: Right Arm, Patient Position: Sitting)    Pulse 66    Resp 20    Wt 227 lb (103 kg)    SpO2 98% Comment: RA   BMI 30.79 kg/m  He looks well. Cardiac exam shows regular rate and rhythm with normal heart sounds.  There is no murmur. Lungs are clear.  Diagnostic Tests:  Narrative & Impression  CLINICAL DATA:  TA follow-up   EXAM: CT ANGIOGRAPHY CHEST WITH CONTRAST   TECHNIQUE: Multidetector CT imaging of the chest was performed using the standard protocol during bolus administration of intravenous contrast. Multiplanar CT image reconstructions and MIPs were obtained to evaluate the vascular anatomy.   RADIATION DOSE REDUCTION: This exam was performed according to the departmental dose-optimization program which includes automated exposure control, adjustment of the mA and/or kV according to patient size and/or use of iterative reconstruction technique.   CONTRAST:  43mL ISOVUE-370 IOPAMIDOL (ISOVUE-370) INJECTION 76%   COMPARISON:  CT chest angio dated  November 20, 2020   FINDINGS: Cardiovascular: Normal heart size. No pericardial effusion. No significant coronary artery calcifications or atherosclerotic disease of the thoracic aorta.   Although not a gated study, measurements obtained on double oblique imaging are as follows:   4.6 cm at sinuses of Valsalva, stable from 4.7 cm previously.   4.0 cm at the sino-tubular junction, stable since 3.9 cm previously.   3.8 cm mid ascending segment, compared to 4.0  cm previously.   3.3 cm distal ascending/proximal arch segment is stable.   2.8 cm proximal descending aortic diameter is stable.   Other:   Mediastinum/Nodes: No pathologically enlarged lymph nodes seen in the chest. Small hiatal hernia.   Lungs/Pleura: Central airways are patent. No consolidation, pleural effusion or pneumothorax.   Upper Abdomen: No acute abnormality.   Musculoskeletal: No chest wall abnormality. No acute or significant osseous findings.   Review of the MIP images confirms the above findings.   IMPRESSION: Stable dilation of the aortic root and ascending thoracic aorta, aortic root measures up to 4.6 cm at the sinuses of Valsalva and ascending thoracic aorta measures up to 4.0 cm. Recommend annual imaging followup by CTA or MRA. This recommendation follows 2010 ACCF/AHA/AATS/ACR/ASA/SCA/SCAI/SIR/STS/SVM Guidelines for the Diagnosis and Management of Patients with Thoracic Aortic Disease. Circulation. 2010; 121: W967-R916. Aortic aneurysm NOS (ICD10-I71.9)     Electronically Signed   By: Allegra Lai M.D.   On: 12/10/2021 11:52      Impression:  This 61 year old gentleman has a stable 4.6 cm aneurysm of the  aortic root with an ascending aorta of 4.0 cm.  This is still well below the surgical threshold of 5.5 cm in a patient with a trileaflet aortic valve.  I reviewed the CTA images with him and answered all of his questions.  I stressed the importance of continued good blood pressure  control in preventing further enlargement and acute aortic dissection.  I advised him against doing any heavy lifting that may require a Valsalva maneuver and could suddenly raise his blood pressure to high levels.  Plan:  He will return to see me in 1 year with a CTA of the chest.  I spent 15 minutes performing this established patient evaluation and > 50% of this time was spent face to face counseling and coordinating the care of this patient's aortic aneurysm.    Alleen Borne, MD [facility] [phone]

## 2022-01-01 ENCOUNTER — Telehealth: Payer: Self-pay | Admitting: Internal Medicine

## 2022-01-01 DIAGNOSIS — I712 Thoracic aortic aneurysm, without rupture, unspecified: Secondary | ICD-10-CM

## 2022-01-01 MED ORDER — METOPROLOL SUCCINATE ER 50 MG PO TB24: ORAL_TABLET | ORAL | 1 refills | Status: DC

## 2022-01-01 NOTE — Telephone Encounter (Signed)
Pt scheduled to see Mack Guise, PA-C, 02/02/22.  Refill for Metoprolol has been sent to [facility].

## 2022-01-01 NOTE — Telephone Encounter (Signed)
°*  STAT* If patient is at the pharmacy, call can be transferred to refill team.   1. Which medications need to be refilled? (please list name of each medication and dose if known) Metoprolol  2. Which pharmacy/location (including street and city if local pharmacy) is medication to be sent to?[facility] [place] [address] [place] , [place]  3. Do they need a 30 day or 90 day supply? Enough until his appointment on 02-02-22

## 2022-01-27 NOTE — Progress Notes (Signed)
? ?Cardiology Office Note ?Date:  01/27/2022  ?Patient ID:  Robert Madden, Robert Madden [birth year], MRN [id number] ?PCP:  Alroy Dust, L.Marlou Sa, MD  ?Electrophysiologist: Dr. Lovena Le ? ?refresh ?  ?Chief Complaint:  annual visit ? ?History of Present Illness: ?Robert Madden is a 61 y.o. male with history of thoracic Aortic aneurysm (follows with Dr. Cyndia Bent), SVT (documented PJRT, terminated with carotid massage), HTN, obesity, HLD ? ?He comes today to be seen for Dr. Lovena Le, last seen by him Dec 2021, at that time had been a couple years for a visit, though doing well without palpitations.  Nochanges were made, he was pending f/u with Dr. Cyndia Bent. ?Advised sodium reduction, and encouraged to lose weight. ? ?He last saw Dr. Cyndia Bent Jan 2023, was newly retired and active at [employer], doing well, stable AO, planned for ongoing annual CT surveillance.   ? ?TODAY ?He is doing well ?PMD checks/monitors his labs ?No CP, palpitations, SOB ?No near syncope or syncope. 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 ?Walking in a treadmill for exercise, staying busy, discussed to avoid heavy lifting ? ?Has not been routinely checking his BP, though prior to retirement, school RN would check his BP for him and generally 120's/80s ? ? ? ?Past Medical History:  ?Diagnosis Date  ? Aortic insufficiency   ? Hyperlipidemia   ? IBS (irritable bowel syndrome)   ? Tachycardia, paroxysmal ([place])   ? ? ?No past surgical history on file. ? ?Current Outpatient Medications  ?Medication Sig Dispense Refill  ? hyoscyamine (ANASPAZ) 0.125 MG TBDP disintergrating tablet Place 0.125 mg under the tongue every 4 (four) hours as needed (reflux).     ? metoprolol succinate (TOPROL-XL) 50 MG 24 hr tablet Take with or immediately following a meal. Please make overdue appt with Dr Lovena Le before anymore refills. Thank you 1st attempt 30 tablet 1  ? pravastatin (PRAVACHOL) 40 MG tablet takes 40  mg every day  12  ? ?No current facility-administered medications for this visit.  ? ? ?Allergies:   Atorvastatin, Simvastatin, and Ampicillin  ? ?Social History:  The patient  reports that he has never smoked. He has never used smokeless tobacco. He reports that he does not drink alcohol and does not use drugs.  ? ?Family History:  The patient's family history includes Hypertension in his mother. ? ?ROS:  Please see the history of present illness.    ?All other systems are reviewed and otherwise negative.  ? ?PHYSICAL EXAM:  ?VS:  There were no vitals taken for this visit. BMI: There is no height or weight on file to calculate BMI. ?Well nourished, well developed, in no acute distress ?HEENT: normocephalic, atraumatic ?Neck: no JVD, carotid bruits or masses ?Cardiac:  RRR; no significant murmurs, no rubs, or gallops ?Lungs:  CTA b/l, no wheezing, rhonchi or rales ?Abd: soft, nontender ?MS: no deformity or atrophy ?Ext: no edema ?Skin: warm and dry, no rash ?Neuro:  No gross deficits appreciated ?Psych: euthymic mood, full affect ? ? ?EKG:  Done today and reviewed by myself shows  ?SR 65bpm, no changes ? ? ?09/09/2015: TTE ?Study Conclusions  ?- Left ventricle: The cavity size was normal. Wall thickness was  ?  increased in a pattern of mild LVH. Systolic function was normal.  ?  The estimated ejection fraction was in the range of 55% to 60%.  ?  Features are consistent with a pseudonormal left ventricular  ?  filling pattern, with concomitant abnormal relaxation and  ?  increased filling pressure (grade 2 diastolic dysfunction).  ?- Aortic valve: There was mild regurgitation.  ?- Mitral valve: There was mild regurgitation ? ?Recent Labs: ?No results found for requested labs within last 8760 hours.  ?No results found for requested labs within last 8760 hours.  ? ?CrCl cannot be calculated (No successful lab value found.).  ? ?Wt Readings from Last 3 Encounters:  ?12/10/21 227 lb (103 kg)  ?11/20/20 229 lb (103.9 kg)   ?10/30/20 229 lb 6.4 oz (104.1 kg)  ?  ? ?Other studies reviewed: ?Additional studies/records reviewed today include: summarized above ? ?ASSESSMENT AND PLAN: ? ?SVT ?Known to be a PJRT ?Sensitive to carotid massage ?Has not felt any palpitations/SVT  ? ?HTN ?Discussed importance of good control especially with his aortic aneurysm ?He will keep an eye on it and let us know if routinely >130/90 ? ?HLD ?Monitored and managed with his PMD ? ?Disposition: F/u with [place] in a year, sooner if needed ? ?Current medicines are reviewed at length with the patient today.  The patient did not have any concerns regarding medicines. ? ?Signed, ?Tommye Standard, PA-C ?01/27/2022 12:23 PM    ? ?[facility] ?[address] ?[address] ?[place] [address] ?[phone] (office)  ?[phone] (fax) ? ? ?

## 2022-02-02 ENCOUNTER — Ambulatory Visit: Payer: BC Managed Care – PPO | Admitting: Physician Assistant

## 2022-02-02 ENCOUNTER — Other Ambulatory Visit: Payer: Self-pay

## 2022-02-02 ENCOUNTER — Encounter: Payer: Self-pay | Admitting: Physician Assistant

## 2022-02-02 VITALS — BP 136/82 | HR 65 | Ht 72.0 in | Wt 228.8 lb

## 2022-02-02 DIAGNOSIS — I471 Supraventricular tachycardia: Secondary | ICD-10-CM | POA: Diagnosis not present

## 2022-02-02 DIAGNOSIS — I10 Essential (primary) hypertension: Secondary | ICD-10-CM | POA: Diagnosis not present

## 2022-02-02 DIAGNOSIS — I7121 Aneurysm of the ascending aorta, without rupture: Secondary | ICD-10-CM

## 2022-02-02 MED ORDER — METOPROLOL SUCCINATE ER 50 MG PO TB24: ORAL_TABLET | ORAL | 2 refills | Status: DC

## 2022-02-02 NOTE — Patient Instructions (Signed)
Medication Instructions:  ° °Your physician recommends that you continue on your current medications as directed. Please refer to the Current Medication list given to you today. ° ° °*If you need a refill on your cardiac medications before your next appointment, please call your pharmacy* ° ° °Lab Work: NONE ORDERED  TODAY ° ° °If you have labs (blood work) drawn today and your tests are completely normal, you will receive your results only by: °MyChart Message (if you have MyChart) OR °A paper copy in the mail °If you have any lab test that is abnormal or we need to change your treatment, we will call you to review the results. ° ° °Testing/Procedures: NONE ORDERED  TODAY ° ° ° ° °Follow-Up: °At [facility], you and your health needs are our priority.  As part of our continuing mission to provide you with exceptional heart care, we have created designated Provider Care Teams.  These Care Teams include your primary Cardiologist (physician) and Advanced Practice Providers (APPs -  Physician Assistants and Nurse Practitioners) who all work together to provide you with the care you need, when you need it. ° °We recommend signing up for the patient portal called "MyChart".  Sign up information is provided on this After Visit Summary.  MyChart is used to connect with patients for Virtual Visits (Telemedicine).  Patients are able to view lab/test results, encounter notes, upcoming appointments, etc.  Non-urgent messages can be sent to your provider as well.   °To learn more about what you can do with MyChart, go to [email or web address].   ° °Your next appointment:   °1 year(s) ° °The format for your next appointment:   °In Person ° °Provider:   °You may see Dr.Taylor  or one of the following Advanced Practice Providers on your designated Care Team:   °Renee Ursuy, PA-C °Michael "Andy" Tillery, PA-C{ ° ° ° °Other Instructions °  °

## 2022-02-26 ENCOUNTER — Other Ambulatory Visit: Payer: Self-pay | Admitting: Internal Medicine

## 2022-02-26 DIAGNOSIS — I7121 Aneurysm of the ascending aorta, without rupture: Secondary | ICD-10-CM

## 2022-11-06 ENCOUNTER — Other Ambulatory Visit: Payer: Self-pay | Admitting: Surgery

## 2022-11-06 DIAGNOSIS — I712 Thoracic aortic aneurysm, without rupture, unspecified: Secondary | ICD-10-CM

## 2022-12-23 ENCOUNTER — Ambulatory Visit: Payer: BC Managed Care – PPO | Admitting: Surgery

## 2022-12-23 ENCOUNTER — Ambulatory Visit
Admission: RE | Admit: 2022-12-23 | Discharge: 2022-12-23 | Disposition: A | Discharge status: Home / Self Care | Payer: BC Managed Care – PPO | Source: Ambulatory Visit | Attending: Surgery | Admitting: Surgery

## 2022-12-23 VITALS — BP 149/80 | HR 67 | Resp 20 | Ht 72.0 in

## 2022-12-23 DIAGNOSIS — I7121 Aneurysm of the ascending aorta, without rupture: Secondary | ICD-10-CM

## 2022-12-23 DIAGNOSIS — I712 Thoracic aortic aneurysm, without rupture, unspecified: Secondary | ICD-10-CM

## 2022-12-23 MED ORDER — IOPAMIDOL (ISOVUE-370) INJECTION 76%
75.0000 mL | Freq: Once | INTRAVENOUS | Status: AC | PRN
  Administered 2022-12-23: 75 mL via INTRAVENOUS

## 2022-12-23 NOTE — Progress Notes (Signed)
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 Previous echocardiogram in 2016 showed a normal trileaflet aortic valve with mild regurgitation. He continues to feel well without chest or back pain.  He continues to remain physically active during retirement doing 30 minutes of cardio every day.  Current Outpatient Medications  Medication Sig Dispense Refill   hyoscyamine (ANASPAZ) 0.125 MG TBDP disintergrating tablet Place 0.125 mg under the tongue every 4 (four) hours as needed (reflux).      metoprolol succinate (TOPROL-XL) 50 MG 24 hr tablet Take 1 tablet (50 mg total) by mouth daily. 90 tablet 3   pravastatin (PRAVACHOL) 40 MG tablet takes 40 mg every day  12   No current facility-administered medications for this visit.     Physical Exam: BP (!) 149/80   Pulse 67   Resp 20   Ht 6' (1.829 m)   SpO2 100% Comment: RA  BMI 31.03 kg/m  He looks well. Cardiac exam shows regular rate and rhythm with normal heart sounds.  There is no murmur. Lungs are clear. There is no peripheral edema  Diagnostic Tests:  Narrative & Impression  CLINICAL DATA:  Acute aortic aneurysm suspected. Follow-up thoracic aortic aneurysm. The weird complex measuring static and   EXAM: CT ANGIOGRAPHY CHEST WITH CONTRAST   TECHNIQUE: Multidetector CT imaging of the chest was performed using the standard protocol during bolus administration of intravenous contrast. Multiplanar CT image reconstructions and MIPs were obtained to evaluate the vascular anatomy.   RADIATION DOSE REDUCTION: This exam was performed according to the departmental dose-optimization program which includes automated exposure control, adjustment of the mA and/or kV according to patient size and/or use of iterative reconstruction technique.   CONTRAST:  33mL ISOVUE-370 IOPAMIDOL (ISOVUE-370) INJECTION 76%   COMPARISON:  None  Available.   FINDINGS: Cardiovascular: No evidence acute aortic dissection or aneurysm. Ascending aorta measures 40 mm by 37 mm at the level the pulmonary outflow tract (image 43/4) compared to 38 mm x 36 mm. Great vessels normal. Descending thoracic aorta normal.   Mediastinum/Nodes: No axillary or supraclavicular adenopathy. No mediastinal or hilar adenopathy. No pericardial fluid. Esophagus normal.   Lungs/Pleura: No suspicious pulmonary nodules. Normal pleural. Airways normal.   Upper Abdomen: Limited view of the liver, kidneys, pancreas are unremarkable. Normal adrenal glands.   Musculoskeletal: No aggressive osseous lesion.   Review of the MIP images confirms the above findings.   IMPRESSION: 1. Stable ascending thoracic aortic aneurysm at 4.0 cm. Recommend annual imaging followup by CTA or MRA. This recommendation follows 2010 ACCF/AHA/AATS/ACR/ASA/SCA/SCAI/SIR/STS/SVM Guidelines for the Diagnosis and Management of Patients with Thoracic Aortic Disease. Circulation. 2010; 121: F621-H086. Aortic aneurysm NOS (ICD10-I71.9) 2. No acute findings.     Electronically Signed   By: Suzy Bouchard M.D.   On: 12/23/2022 13:15      Impression:  I have personally reviewed his CTA of the chest done today.  The aortic root measures 4.5 cm at the sinus of Valsalva level.  This is unchanged from his previous scan when it was measured at 4.6 cm.  His ascending aorta measures 4 cm.  This is unchanged.  His aorta is still below the surgical threshold of 5.5 cm and remains stable.  I reviewed the CTA images with him and answered his questions.  I stressed the importance of continued good blood pressure control in preventing further enlargement and acute  aortic dissection.  I advised him against doing any heavy lifting that may require a Valsalva maneuver and could suddenly raise his blood pressure to high levels.  His aneurysm has been stable and I think we can wait 2 years to do repeat  the CTA.  He is in agreement with that.  Plan:  He will return to see me in 2 years with a CTA of the chest for aortic surveillance.  I spent 10 minutes performing this established patient evaluation and > 50% of this time was spent face to face counseling and coordinating the care of this patient's aortic aneurysm.    Gaye Pollack, MD [facility] [phone]

## 2023-03-29 ENCOUNTER — Telehealth: Payer: Self-pay

## 2023-03-29 ENCOUNTER — Other Ambulatory Visit: Payer: Self-pay

## 2023-03-29 NOTE — Telephone Encounter (Signed)
The patient called and wanted a refill on his metoprolol 50 mg daily medication. The patient was told that the medication was discontinued on Alleen Borne, MD on 12/23/22.  The patient said that it wasn't any discussion about the medication being discontinued.  The patient was told that I would send a message to Dr. Lubertha Basque nurse and for the patient to also call Dr. Sharee Pimple office.

## 2023-03-30 ENCOUNTER — Other Ambulatory Visit: Payer: Self-pay | Admitting: Internal Medicine

## 2023-03-30 ENCOUNTER — Other Ambulatory Visit: Payer: Self-pay

## 2023-03-30 MED ORDER — METOPROLOL SUCCINATE ER 50 MG PO TB24: 50.0000 mg | ORAL_TABLET | Freq: Every day | ORAL | 0 refills | Status: DC

## 2023-04-15 NOTE — Telephone Encounter (Signed)
Called Pt from [facility] to follow up on Metoprolol refill request; It appears Pt received refill on 03/30/23 as 30 tablets were sent.   Unable to speak with Pt, voicemail left, no appointment with [facility] seen in Epic.  Follow up cardiology appointment required for more medication.

## 2023-04-16 ENCOUNTER — Telehealth: Payer: Self-pay | Admitting: Internal Medicine

## 2023-04-16 NOTE — Telephone Encounter (Signed)
*  STAT* If patient is at the pharmacy, call can be transferred to refill team.   1. Which medications need to be refilled? (please list name of each medication and dose if known)  metoprolol succinate (TOPROL-XL) 50 MG 24 hr tablet   2. Which pharmacy/location (including street and city if local pharmacy) is medication to be sent to? [id number] - [place], [place] - [address] AT [facility] OF [address]    3. Do they need a 30 day or 90 day supply? Needs enough to last until 06/25 appt

## 2023-04-19 MED ORDER — METOPROLOL SUCCINATE ER 50 MG PO TB24: 50.0000 mg | ORAL_TABLET | Freq: Every day | ORAL | 0 refills | Status: DC

## 2023-04-19 NOTE — Telephone Encounter (Signed)
Pt has appointment 05/25/23, 30 day refill of Metoprolol has been sent in.

## 2023-04-22 ENCOUNTER — Other Ambulatory Visit: Payer: Self-pay | Admitting: Internal Medicine

## 2023-04-24 ENCOUNTER — Other Ambulatory Visit: Payer: Self-pay | Admitting: Internal Medicine

## 2023-05-25 ENCOUNTER — Ambulatory Visit: Payer: BC Managed Care – PPO | Attending: Student | Admitting: Pulmonary Disease

## 2023-05-25 ENCOUNTER — Encounter: Payer: Self-pay | Admitting: Student

## 2023-05-25 VITALS — BP 120/82 | HR 64 | Ht 72.0 in | Wt 213.4 lb

## 2023-05-25 DIAGNOSIS — I471 Supraventricular tachycardia, unspecified: Secondary | ICD-10-CM

## 2023-05-25 DIAGNOSIS — I10 Essential (primary) hypertension: Secondary | ICD-10-CM

## 2023-05-25 DIAGNOSIS — I7121 Aneurysm of the ascending aorta, without rupture: Secondary | ICD-10-CM | POA: Diagnosis not present

## 2023-05-25 MED ORDER — METOPROLOL SUCCINATE ER 50 MG PO TB24
50.0000 mg | ORAL_TABLET | Freq: Every day | ORAL | 3 refills | Status: DC
Start: 2023-05-25 — End: 2024-03-02

## 2023-05-25 NOTE — Progress Notes (Signed)
  Electrophysiology Office Note:   Date:  05/25/2023  ID:  Robert Madden, DOB May 13, 1961, MRN [id number]  Primary Cardiologist: None Electrophysiologist: Lewayne Bunting, MD      History of Present Illness:   Robert Madden is a 62 y.o. male with h/o IBS, HLD, HTN,  aortic root dilatation / aortic aneurysm & SVT seen today for routine electrophysiology followup.    Since last being seen in our clinic the patient reports doing well.  States he has been fasting intermittently and has lost weight.  He is down ~15lbs in the last year. States he walks, uses the recumbent bike and treadmill.  Notes he will have an occasional sensation that the fast rate might come through but does not.    He denies chest pain, palpitations, dyspnea, PND, orthopnea, nausea, vomiting, dizziness, syncope, edema, weight gain, or early satiety.   Review of systems complete and found to be negative unless listed in HPI.    EP Information / Studies Reviewed:    Studies 2016 ECHO > LVEF 55-60%, mild LVH, Grade II diastolic dysfunction, mild AVR, mild MVR 11/2022 CTA Chest Aorta > stable ascending thoracic aortic aneurysm at 4cm 05/25/23 EKG > NSR   Arrhythmia / Device  SVT (PJRT, terminated with carotid massage) on Toprol  AAD / Anticoagulation             Physical Exam:   VS:  BP 120/82   Pulse 64   Ht 6' (1.829 m)   Wt 213 lb 6.4 oz (96.8 kg)   SpO2 98%   BMI 28.94 kg/m    Wt Readings from Last 3 Encounters:  05/25/23 213 lb 6.4 oz (96.8 kg)  02/02/22 228 lb 12.8 oz (103.8 kg)  12/10/21 227 lb (103 kg)     GEN: Well nourished, well developed in no acute distress NECK: No JVD; No carotid bruits CARDIAC: Regular rate and rhythm, no murmurs, rubs, gallops RESPIRATORY:  Clear to auscultation without rales, wheezing or rhonchi  ABDOMEN: Soft, non-tender, non-distended EXTREMITIES:  No edema; No deformity   ASSESSMENT AND PLAN:    SVT  -continue toprol, refills x1 yr  -EKG reviewed, NSR    HLD  -per primary   Thoracic Aortic Aneurysm  -followed by Dr. Laneta Simmers, last seen 11/2022 with plan for 68yr follow up  Follow up with Dr. Ladona Ridgel in 12 months   Signed, Canary Brim, MSN, APRN, NP-C, AGACNP-BC [facility] - Electrophysiology  05/25/2023, 8:45 AM

## 2023-05-25 NOTE — Patient Instructions (Signed)
Medication Instructions:  Your physician recommends that you continue on your current medications as directed. Please refer to the Current Medication list given to you today.  *If you need a refill on your cardiac medications before your next appointment, please call your pharmacy*  Lab Work: None ordered If you have labs (blood work) drawn today and your tests are completely normal, you will receive your results only by: MyChart Message (if you have MyChart) OR A paper copy in the mail If you have any lab test that is abnormal or we need to change your treatment, we will call you to review the results.  Follow-Up: At [facility], you and your health needs are our priority.  As part of our continuing mission to provide you with exceptional heart care, we have created designated Provider Care Teams.  These Care Teams include your primary Cardiologist (physician) and Advanced Practice Providers (APPs -  Physician Assistants and Nurse Practitioners) who all work together to provide you with the care you need, when you need it.  Your next appointment:   1 year(s)  Provider:   Gregg Taylor, MD  

## 2024-01-03 ENCOUNTER — Emergency Department (HOSPITAL_COMMUNITY): Admission: EM | Admit: 2024-01-03 | Discharge: 2024-01-03 | Discharge status: Against Medical Advice | Payer: 59

## 2024-01-03 NOTE — ED Notes (Addendum)
Pt left, pt gave pt labels to registration

## 2024-01-06 ENCOUNTER — Encounter: Payer: Self-pay | Admitting: Internal Medicine

## 2024-01-06 ENCOUNTER — Ambulatory Visit (INDEPENDENT_AMBULATORY_CARE_PROVIDER_SITE_OTHER): Payer: 59 | Admitting: Internal Medicine

## 2024-01-06 ENCOUNTER — Other Ambulatory Visit: Payer: Self-pay

## 2024-01-06 VITALS — BP 120/75 | HR 78 | Temp 99.0°F | Ht 72.0 in | Wt 214.0 lb

## 2024-01-06 DIAGNOSIS — R509 Fever, unspecified: Secondary | ICD-10-CM | POA: Diagnosis not present

## 2024-01-06 DIAGNOSIS — E785 Hyperlipidemia, unspecified: Secondary | ICD-10-CM | POA: Diagnosis not present

## 2024-01-06 DIAGNOSIS — R519 Headache, unspecified: Secondary | ICD-10-CM | POA: Diagnosis not present

## 2024-01-06 NOTE — Progress Notes (Signed)
 Patient: Robert Madden  DOB: 12/18/1960 MRN: [id number] PCP: [name] CROME.Addie, MD (Inactive)      Patient Active Problem List   Diagnosis Date Noted   Irritable bowel syndrome without diarrhea 11/20/2020   Aortic root dilatation (HCC) 11/20/2020   SVT (supraventricular tachycardia) (HCC) 08/30/2015   Hypercholesterolemia without hypertriglyceridemia 02/25/2015   Aortic aneurysm, thoracic (HCC) 12/06/2013     Subjective:  Robert Madden is a 63 y.o. M with PMHx as below including IBS, HLD, HTN, aotic root dilation/thoracic aortic aneurym followed by CTS(Dr Bartle) and SVT followed by EP on toprol   referred by PCP Dr. Luwana Sor for HA, fever  HE was seen on 01/05/24 and noted for last 12 days. He has had double visonn and HA, mildly elevatd LFT, rest of work up benight as below.  Today 01/06/24:  Pt's highest temp 102.4 around January 29th. Since then temp has been hovering 99-100. Temperature rises in mid-late afternoon.  HA started with fevers. HA are on left temporal region with straining feeling. A couple episodes of blurry vison that has resolved. He has had peripheral floaters. Dull HA is present most of the time. Denies N/V/abdominal pain. Has a post cvid cough. Fever started once contact with grandson 28 years old.  Pt's symptoms are relativley unchanged.  Pt had COVID in October , 2024.  Testing by PCP:  Spotted fever If IgG and IgM negaitve 01/01/24 Lyme total Ab negative(srology reflex) on 12/31/23 Ehrlichia Ab panel negative( IgM and /IgG, HGE IgG and IgM negaiece UA benight(negative nitriets leukocttes CMP AST/ALT 55/104, tbili 1.2 Wbc 7.7plt225, hb 14.2 Covid/flu negative   Exposure Hx: Travel: no outside [place]  travel. Hobby: music. Christian music Work: reitred education officer, community Pets: Community education officer, cat  Review of Systems  All other systems reviewed and are negative.   Past Medical History:  Diagnosis Date   Aortic insufficiency    Hyperlipidemia     IBS (irritable bowel syndrome)    Tachycardia, paroxysmal (HCC)     Outpatient Medications Prior to Visit  Medication Sig Dispense Refill   hyoscyamine (ANASPAZ) 0.125 MG TBDP disintergrating tablet Place 0.125 mg under the tongue every 4 (four) hours as needed (reflux).      metoprolol  succinate (TOPROL -XL) 50 MG 24 hr tablet Take 1 tablet (50 mg total) by mouth daily. 90 tablet 3   pravastatin  (PRAVACHOL ) 40 MG tablet takes 40 mg every day  12   No facility-administered medications prior to visit.     Allergies  Allergen Reactions   Atorvastatin     Other reaction(s): joint aches   Simvastatin Other (See Comments)    Joint aches   Ampicillin  Rash    Social History   Tobacco Use   Smoking status: Never   Smokeless tobacco: Never  Vaping Use   Vaping status: Never Used  Substance Use Topics   Alcohol use: No   Drug use: No    Family History  Problem Relation Age of Onset   Hypertension Mother    Heart attack Neg Hx    Stroke Neg Hx     Objective:  There were no vitals filed for this visit. There is no height or weight on file to calculate BMI.  Physical Exam Constitutional:      General: He is not in acute distress.    Appearance: He is normal weight. He is not toxic-appearing.  HENT:     Head: Normocephalic and atraumatic.     Right Ear: External  ear normal.     Left Ear: External ear normal.     Nose: No congestion or rhinorrhea.     Mouth/Throat:     Mouth: Mucous membranes are moist.     Pharynx: Oropharynx is clear.  Eyes:     Extraocular Movements: Extraocular movements intact.     Conjunctiva/sclera: Conjunctivae normal.     Pupils: Pupils are equal, round, and reactive to light.  Cardiovascular:     Rate and Rhythm: Normal rate and regular rhythm.     Heart sounds: No murmur heard.    No friction rub. No gallop.  Pulmonary:     Effort: Pulmonary effort is normal.     Breath sounds: Normal breath sounds.  Abdominal:     General: Abdomen is  flat. Bowel sounds are normal.     Palpations: Abdomen is soft.  Musculoskeletal:        General: No swelling. Normal range of motion.     Cervical back: Normal range of motion and neck supple.  Skin:    General: Skin is warm and dry.  Neurological:     General: No focal deficit present.     Mental Status: He is oriented to person, place, and time.  Psychiatric:        Mood and Affect: Mood normal.     Lab Results: No results found for: WBC, HGB, HCT, MCV, PLT No results found for: CREATININE, BUN, NA, K, CL, CO2 No results found for: ALT, AST, GGT, ALKPHOS, BILITOT   Assessment & Plan:  #HA #Low grade Fever -Pt states fever and left temporal HA with peripheral glimmer started 3 weeks ago. HE had Tmax on 102 at end of January and temp 99-100 since then. Work up with PCP showed mildly elevated LFTs and tbilli. Coul be some sort of post viral symdrome. Will get MRI given peripheral dull HA.  -Autoimmune panel with esr, crp for low grade fever. -HIV, RPR( pt ina monogamous relationship with wife 30+ years). Blood Cx Plan: -Follow up in one month. If symptoms persist refer to neuro. If symptoms worsen in the interim counseled to call clinic.   #HLD #Mildly elevate LFTS -off of statin x 2months. Pt has tried other statins has had to switch due to intolerace. Rx praavastatin now -Lipid panel -Acute Hep panel   Loney Stank, MD [facility]   01/06/24  8:39 AM   I have personally spent 65 minutes involved in face-to-face and non-face-to-face activities for this patient on the day of the visit. Professional time spent includes the following activities: Preparing to see the patient (review of tests), Obtaining and/or reviewing separately obtained history (admission/discharge record), Performing a medically appropriate examination and/or evaluation , Ordering medications/tests/procedures, referring  and communicating with other health care professionals, Documenting clinical information in the EMR, Independently interpreting results (not separately reported), Communicating results to the patient/family/caregiver, Counseling and educating the patient/family/caregiver and Care coordination (not separately reported).

## 2024-01-07 ENCOUNTER — Inpatient Hospital Stay (HOSPITAL_COMMUNITY)
Admission: EM | Admit: 2024-01-07 | Discharge: 2024-01-12 | DRG: 871 | Disposition: A | Discharge status: Home Health | Payer: 59 | Attending: Internal Medicine | Admitting: Internal Medicine

## 2024-01-07 ENCOUNTER — Inpatient Hospital Stay (HOSPITAL_COMMUNITY): Payer: 59

## 2024-01-07 ENCOUNTER — Other Ambulatory Visit: Payer: Self-pay

## 2024-01-07 ENCOUNTER — Telehealth: Payer: Self-pay | Admitting: Internal Medicine

## 2024-01-07 ENCOUNTER — Encounter (HOSPITAL_COMMUNITY): Payer: Self-pay

## 2024-01-07 ENCOUNTER — Telehealth: Payer: Self-pay

## 2024-01-07 ENCOUNTER — Emergency Department (HOSPITAL_COMMUNITY): Payer: 59

## 2024-01-07 DIAGNOSIS — R7881 Bacteremia: Secondary | ICD-10-CM

## 2024-01-07 DIAGNOSIS — I358 Other nonrheumatic aortic valve disorders: Secondary | ICD-10-CM | POA: Diagnosis not present

## 2024-01-07 DIAGNOSIS — I330 Acute and subacute infective endocarditis: Secondary | ICD-10-CM | POA: Diagnosis present

## 2024-01-07 DIAGNOSIS — I38 Endocarditis, valve unspecified: Secondary | ICD-10-CM

## 2024-01-07 DIAGNOSIS — E7800 Pure hypercholesterolemia, unspecified: Secondary | ICD-10-CM | POA: Diagnosis present

## 2024-01-07 DIAGNOSIS — R7989 Other specified abnormal findings of blood chemistry: Secondary | ICD-10-CM | POA: Diagnosis present

## 2024-01-07 DIAGNOSIS — L299 Pruritus, unspecified: Secondary | ICD-10-CM | POA: Diagnosis not present

## 2024-01-07 DIAGNOSIS — R064 Hyperventilation: Secondary | ICD-10-CM | POA: Diagnosis present

## 2024-01-07 DIAGNOSIS — K589 Irritable bowel syndrome without diarrhea: Secondary | ICD-10-CM | POA: Diagnosis present

## 2024-01-07 DIAGNOSIS — R519 Headache, unspecified: Secondary | ICD-10-CM | POA: Diagnosis not present

## 2024-01-07 DIAGNOSIS — Z79899 Other long term (current) drug therapy: Secondary | ICD-10-CM

## 2024-01-07 DIAGNOSIS — I712 Thoracic aortic aneurysm, without rupture, unspecified: Secondary | ICD-10-CM | POA: Diagnosis present

## 2024-01-07 DIAGNOSIS — I340 Nonrheumatic mitral (valve) insufficiency: Secondary | ICD-10-CM | POA: Diagnosis not present

## 2024-01-07 DIAGNOSIS — E871 Hypo-osmolality and hyponatremia: Secondary | ICD-10-CM | POA: Diagnosis present

## 2024-01-07 DIAGNOSIS — H538 Other visual disturbances: Secondary | ICD-10-CM | POA: Diagnosis present

## 2024-01-07 DIAGNOSIS — R238 Other skin changes: Secondary | ICD-10-CM | POA: Diagnosis present

## 2024-01-07 DIAGNOSIS — Z8249 Family history of ischemic heart disease and other diseases of the circulatory system: Secondary | ICD-10-CM | POA: Diagnosis not present

## 2024-01-07 DIAGNOSIS — I471 Supraventricular tachycardia, unspecified: Secondary | ICD-10-CM | POA: Diagnosis present

## 2024-01-07 DIAGNOSIS — B952 Enterococcus as the cause of diseases classified elsewhere: Secondary | ICD-10-CM | POA: Diagnosis present

## 2024-01-07 DIAGNOSIS — Z8616 Personal history of COVID-19: Secondary | ICD-10-CM | POA: Diagnosis not present

## 2024-01-07 DIAGNOSIS — I479 Paroxysmal tachycardia, unspecified: Secondary | ICD-10-CM | POA: Diagnosis present

## 2024-01-07 DIAGNOSIS — Z880 Allergy status to penicillin: Secondary | ICD-10-CM | POA: Diagnosis not present

## 2024-01-07 DIAGNOSIS — L0889 Other specified local infections of the skin and subcutaneous tissue: Secondary | ICD-10-CM | POA: Diagnosis present

## 2024-01-07 DIAGNOSIS — B9689 Other specified bacterial agents as the cause of diseases classified elsewhere: Secondary | ICD-10-CM | POA: Diagnosis present

## 2024-01-07 DIAGNOSIS — I7121 Aneurysm of the ascending aorta, without rupture: Secondary | ICD-10-CM | POA: Diagnosis not present

## 2024-01-07 DIAGNOSIS — R7401 Elevation of levels of liver transaminase levels: Secondary | ICD-10-CM | POA: Diagnosis present

## 2024-01-07 DIAGNOSIS — R7981 Abnormal blood-gas level: Secondary | ICD-10-CM | POA: Diagnosis present

## 2024-01-07 DIAGNOSIS — I351 Nonrheumatic aortic (valve) insufficiency: Secondary | ICD-10-CM | POA: Diagnosis not present

## 2024-01-07 DIAGNOSIS — I10 Essential (primary) hypertension: Secondary | ICD-10-CM | POA: Diagnosis not present

## 2024-01-07 LAB — ECHOCARDIOGRAM COMPLETE
Area-P 1/2: 3.6 cm2
Calc EF: 68.9 %
Height: 72 in
P 1/2 time: 562 ms
S' Lateral: 2.7 cm
Single Plane A2C EF: 69.4 %
Single Plane A4C EF: 71.1 %
Weight: 3424 [oz_av]

## 2024-01-07 LAB — CBC WITH DIFFERENTIAL/PLATELET
Abs Immature Granulocytes: 0.03 10*3/uL (ref 0.00–0.07)
Basophils Absolute: 0 10*3/uL (ref 0.0–0.1)
Basophils Relative: 0 %
Eosinophils Absolute: 0.1 10*3/uL (ref 0.0–0.5)
Eosinophils Relative: 1 %
HCT: 40.2 % (ref 39.0–52.0)
Hemoglobin: 13.5 g/dL (ref 13.0–17.0)
Immature Granulocytes: 0 %
Lymphocytes Relative: 20 %
Lymphs Abs: 1.6 10*3/uL (ref 0.7–4.0)
MCH: 29.2 pg (ref 26.0–34.0)
MCHC: 33.6 g/dL (ref 30.0–36.0)
MCV: 86.8 fL (ref 80.0–100.0)
Monocytes Absolute: 0.6 10*3/uL (ref 0.1–1.0)
Monocytes Relative: 8 %
Neutro Abs: 5.5 10*3/uL (ref 1.7–7.7)
Neutrophils Relative %: 71 %
Platelets: 298 10*3/uL (ref 150–400)
RBC: 4.63 MIL/uL (ref 4.22–5.81)
RDW: 12.8 % (ref 11.5–15.5)
WBC: 7.8 10*3/uL (ref 4.0–10.5)
nRBC: 0 % (ref 0.0–0.2)

## 2024-01-07 LAB — HEPATIC FUNCTION PANEL
ALT: 63 U/L — ABNORMAL HIGH (ref 0–44)
ALT: 61 U/L — ABNORMAL HIGH (ref 0–44)
AST: 33 U/L (ref 15–41)
AST: 33 U/L (ref 15–41)
Albumin: 3.6 g/dL (ref 3.5–5.0)
Albumin: 3.4 g/dL — ABNORMAL LOW (ref 3.5–5.0)
Alkaline Phosphatase: 70 U/L (ref 38–126)
Alkaline Phosphatase: 68 U/L (ref 38–126)
Bilirubin, Direct: 0.2 mg/dL (ref 0.0–0.2)
Bilirubin, Direct: 0.2 mg/dL (ref 0.0–0.2)
Indirect Bilirubin: 1 mg/dL — ABNORMAL HIGH (ref 0.3–0.9)
Indirect Bilirubin: 0.6 mg/dL (ref 0.3–0.9)
Total Bilirubin: 1.2 mg/dL (ref 0.0–1.2)
Total Bilirubin: 0.8 mg/dL (ref 0.0–1.2)
Total Protein: 7.7 g/dL (ref 6.5–8.1)
Total Protein: 7.5 g/dL (ref 6.5–8.1)

## 2024-01-07 LAB — BASIC METABOLIC PANEL
Anion gap: 11 (ref 5–15)
BUN: 16 mg/dL (ref 6–20)
CO2: 21 mmol/L — ABNORMAL LOW (ref 22–32)
Calcium: 9 mg/dL (ref 8.9–10.3)
Chloride: 101 mmol/L (ref 98–111)
Creatinine, Ser: 1.17 mg/dL (ref 0.61–1.24)
GFR, Estimated: >60 mL/min (ref >60)
Glucose, Bld: 96 mg/dL (ref 70–99)
Potassium: 4.2 mmol/L (ref 3.5–5.1)
Sodium: 133 mmol/L — ABNORMAL LOW (ref 135–145)

## 2024-01-07 LAB — LACTIC ACID, PLASMA
Lactic Acid, Venous: 0.9 mmol/L (ref 0.5–1.9)
Lactic Acid, Venous: 1.2 mmol/L (ref 0.5–1.9)

## 2024-01-07 MED ORDER — VANCOMYCIN HCL IN DEXTROSE 1-5 GM/200ML-% IV SOLN
1000.0000 mg | Freq: Two times a day (BID) | INTRAVENOUS | Status: DC
Start: 2024-01-08 — End: 2024-01-07

## 2024-01-07 MED ORDER — VANCOMYCIN HCL 2000 MG/400ML IV SOLN
2000.0000 mg | Freq: Once | INTRAVENOUS | Status: AC
  Administered 2024-01-07: 2000 mg via INTRAVENOUS
  Filled 2024-01-07: qty 400

## 2024-01-07 MED ORDER — SODIUM CHLORIDE 0.9 % IV SOLN: 8.0000 mg/kg | Freq: Every day | INTRAVENOUS | Status: DC

## 2024-01-07 MED ORDER — ACETAMINOPHEN 650 MG RE SUPP: 650.0000 mg | Freq: Four times a day (QID) | RECTAL | Status: DC | PRN

## 2024-01-07 MED ORDER — ENOXAPARIN SODIUM 40 MG/0.4ML IJ SOSY
40.0000 mg | PREFILLED_SYRINGE | INTRAMUSCULAR | Status: DC
  Administered 2024-01-07 – 2024-01-11 (×5): 40 mg via SUBCUTANEOUS
  Filled 2024-01-07 (×5): qty 0.4

## 2024-01-07 MED ORDER — ONDANSETRON HCL 4 MG PO TABS
4.0000 mg | ORAL_TABLET | Freq: Four times a day (QID) | ORAL | Status: DC | PRN
Start: 2024-01-07 — End: 2024-01-12

## 2024-01-07 MED ORDER — DAPTOMYCIN-SODIUM CHLORIDE 700-0.9 MG/100ML-% IV SOLN: 8.0000 mg/kg | Freq: Every day | INTRAVENOUS | Status: DC

## 2024-01-07 MED ORDER — ACETAMINOPHEN 325 MG PO TABS
650.0000 mg | ORAL_TABLET | Freq: Four times a day (QID) | ORAL | Status: DC | PRN
  Filled 2024-01-07 (×2): qty 2

## 2024-01-07 MED ORDER — ONDANSETRON HCL 4 MG/2ML IJ SOLN: 4.0000 mg | Freq: Four times a day (QID) | INTRAMUSCULAR | Status: DC | PRN

## 2024-01-07 MED ORDER — VANCOMYCIN HCL 1250 MG/250ML IV SOLN
1250.0000 mg | Freq: Two times a day (BID) | INTRAVENOUS | Status: DC
  Administered 2024-01-07 – 2024-01-09 (×5): 1250 mg via INTRAVENOUS
  Filled 2024-01-07 (×6): qty 250

## 2024-01-07 MED ORDER — METOPROLOL SUCCINATE ER 50 MG PO TB24
50.0000 mg | ORAL_TABLET | Freq: Every day | ORAL | Status: DC
  Administered 2024-01-09 – 2024-01-12 (×4): 50 mg via ORAL
  Filled 2024-01-07 (×5): qty 1

## 2024-01-07 MED ORDER — GADOBUTROL 1 MMOL/ML IV SOLN
10.0000 mL | Freq: Once | INTRAVENOUS | Status: AC | PRN
  Administered 2024-01-07: 10 mL via INTRAVENOUS

## 2024-01-07 MED ORDER — DEXTROSE-SODIUM CHLORIDE 5-0.9 % IV SOLN: INTRAVENOUS | Status: AC

## 2024-01-07 NOTE — Progress Notes (Addendum)
 Pharmacy Antibiotic Note  Robert Madden is a 63 y.o. male who was seen by Dr. Dennise at [facility] on 01/06/24 for evaluation of fever and HA. Three of four blood culture bottles collected on 01/06/24 resulted back with [facility]. He was instructed to come to the ED on 01/07/24 for treatment with IV abx.  Pharmacy has been consulted to dose vancomycin  for bacteremia.  Today, 01/07/2024: - scr 1.17 (crcl~81) - wbc wnl   Plan: - vancomycin  2000 mg IV x1, then 1000 mg q12h for est AUC 437   Adden:  - Per Dr. Fleeta Rothman, change vancomycin  to Daptomycin  - daptomycin  8 mg/kg q24h  - CK on 2/8 and weekly   ______________________________________________  Height: 6' (182.9 cm) Weight: 97.1 kg (214 lb) IBW/kg (Calculated) : 77.6  Temp (24hrs), Avg:99.2 F (37.3 C), Min:99.2 F (37.3 C), Max:99.2 F (37.3 C)  Recent Labs  Lab 01/07/24 1053  WBC 7.8    CrCl cannot be calculated (No successful lab value found.).    Allergies  Allergen Reactions   Atorvastatin     Other reaction(s): joint aches   Simvastatin Other (See Comments)    Joint aches   Ampicillin  Rash   Honey Rash     Thank you for allowing pharmacy to be a part of this patient's care.  Osie Iantha SQUIBB 01/07/2024 11:28 AM

## 2024-01-07 NOTE — H&P (Signed)
 History and Physical    Patient: Robert Madden [name] DOB: 1961-03-01 DOA: 01/07/2024 DOS: the patient was seen and examined on 01/07/2024 PCP: [name], [name], MD (Inactive)  Patient coming from: Home  Chief Complaint:  Chief Complaint  Patient presents with   Fever   HPI: Robert Madden is a 63 y.o. male with medical history significant of aortic insufficiency, aortic root dilation, thoracic aortic aneurysm, hyperlipidemia, IBS, paroxysmal tachycardia who has been having fever associated with malaise for the past 3 weeks and has been referred by ID to the emergency department for admission due to blood cultures growing gram-positive bacteria. He denied rhinorrhea, sore throat, wheezing or hemoptysis.  No chest pain, palpitations, diaphoresis, PND, orthopnea or pitting edema of the lower extremities.  No abdominal pain, nausea, emesis, diarrhea, constipation, melena or hematochezia.  No flank pain, dysuria, frequency or hematuria.  No polyuria, polydipsia, polyphagia or blurred vision.   Lab work: CBC and lactic acid were normal.  BMP showed a sodium 133 and a CO2 of 21 mmol/L with a normal anion gap, the rest of the BMP measurements were normal.  Hepatic function panel show an ALT of 62 units/L and indirect bilirubin of 1.0 mg/dL, but total bilirubin was 1.2 mg/dL.  The rest of the hepatic functions were normal.  Imaging: 2 view chest radiograph showing no active cardiopulmonary disease.  Normal heart size and mediastinal contours.   ED course: Initial vital signs were temperature 99.2 F, pulse 79, respiration 20, BP 133/92 mmHg O2 sat 97% on room air.  Patient received 2000 mg of vancomycin .  Review of Systems: As mentioned in the history of present illness. All other systems reviewed and are negative. Past Medical History:  Diagnosis Date   Aortic insufficiency    Hyperlipidemia    IBS (irritable bowel syndrome)    Tachycardia, paroxysmal (HCC)    History reviewed. No  pertinent surgical history. Social History:  reports that he has never smoked. He has never used smokeless tobacco. He reports that he does not drink alcohol and does not use drugs.  Allergies  Allergen Reactions   Atorvastatin     Other reaction(s): joint aches   Simvastatin Other (See Comments)    Joint aches   Ampicillin  Rash   Honey Rash    Family History  Problem Relation Age of Onset   Hypertension Mother    Heart attack Neg Hx    Stroke Neg Hx     Prior to Admission medications   Medication Sig Start Date End Date Taking? Authorizing Provider  doxycycline (VIBRA-TABS) 100 MG tablet 1 tablet with food Orally Twice a day for 10 days 12/30/23   [provider]  hyoscyamine (ANASPAZ) 0.125 MG TBDP disintergrating tablet Place 0.125 mg under the tongue every 4 (four) hours as needed (reflux).  Patient not taking: Reported on 01/06/2024    [provider]  ibuprofen (ADVIL) 200 MG tablet 2-3 tablet as needed Orally every 6 hrs    [provider]  metoprolol  succinate (TOPROL -XL) 50 MG 24 hr tablet Take 1 tablet (50 mg total) by mouth daily. 05/25/23   Aniceto Daphne CROME, NP  pravastatin  (PRAVACHOL ) 40 MG tablet takes 40 mg every day Patient not taking: Reported on 01/06/2024 08/15/15   [provider]    Physical Exam: Vitals:   01/07/24 1018 01/07/24 1019  BP: (!) 133/92   Pulse: 79   Resp: 20   Temp: 99.2 F (37.3 C)   TempSrc: Oral  SpO2: 97%   Weight:  97.1 kg  Height:  6' (1.829 m)   Physical Exam Vitals and nursing note reviewed.  Constitutional:      General: He is awake. He is not in acute distress.    Appearance: Normal appearance. He is ill-appearing.  HENT:     Head: Normocephalic.     Nose: No rhinorrhea.  Eyes:     General: No scleral icterus.    Pupils: Pupils are equal, round, and reactive to light.  Neck:     Vascular: No JVD.  Cardiovascular:     Rate and Rhythm: Normal rate and regular rhythm.     Heart sounds:  S1 normal and S2 normal.  Pulmonary:     Effort: Pulmonary effort is normal.     Breath sounds: Normal breath sounds. No wheezing, rhonchi or rales.  Abdominal:     General: Bowel sounds are normal. There is no distension.     Palpations: Abdomen is soft.     Tenderness: There is no abdominal tenderness.  Musculoskeletal:     Cervical back: Neck supple.     Right lower leg: No edema.     Left lower leg: No edema.  Skin:    General: Skin is warm and dry.  Neurological:     General: No focal deficit present.     Mental Status: He is alert and oriented to person, place, and time.  Psychiatric:        Mood and Affect: Mood normal.        Behavior: Behavior normal. Behavior is cooperative.     Data Reviewed:  Results are pending, will review when available.  09/09/2015 Echo -------------------------------------------------------------------  LV EF: 55% -   60%   -------------------------------------------------------------------  Indications:     I47.1 Supraventricular Tachycardia.   -------------------------------------------------------------------  History:  PMH:  Acquired from the patient and from the patient&'s  chart. Risk factors:  Dyslipidemia.   -------------------------------------------------------------------  Study Conclusions   - Left ventricle: The cavity size was normal. Wall thickness was    increased in a pattern of mild LVH. Systolic function was normal.    The estimated ejection fraction was in the range of 55% to 60%.    Features are consistent with a pseudonormal left ventricular    filling pattern, with concomitant abnormal relaxation and    increased filling pressure (grade 2 diastolic dysfunction).  - Aortic valve: There was mild regurgitation.  - Mitral valve: There was mild regurgitation.   Assessment and Plan: Principal Problem:   Bacteremia Admit to MedSurg/inpatient. Continue IV fluids. Continue vancomycin  per pharmacy. Follow-up blood  culture and sensitivity Follow CBC and CMP in a.m. Check echocardiogram. Check MRI of the brain. ID consult appreciated.  Active Problems:   Abnormal LFTs RUQ US  was normal. Follow-up LFTs in AM.    Hyponatremia Minimal. Continue IV fluids. Follow-up sodium level in AM.    Hypocarbia Likely from hyperventilation at arrival. Follow-up CO2 level in AM.    Aortic aneurysm, thoracic (HCC) Follow-up with vascular surgery as scheduled as an OP.    SVT (supraventricular tachycardia) (HCC) BP has been soft at times. Continue metoprolol  succinate 50 mg p.o. in AM.    Hypercholesterolemia without hypertriglyceridemia Currently off statin. Follow-up LFTs.   Follow-up with primary care provider.     Advance Care Planning:   Code Status: Full Code   Consults: Infectious diseases (Dr. Fleeta Rothman).  Family Communication:   Severity of Illness: The appropriate patient status for  this patient is INPATIENT. Inpatient status is judged to be reasonable and necessary in order to provide the required intensity of service to ensure the patient's safety. The patient's presenting symptoms, physical exam findings, and initial radiographic and laboratory data in the context of their chronic comorbidities is felt to place them at high risk for further clinical deterioration. Furthermore, it is not anticipated that the patient will be medically stable for discharge from the hospital within 2 midnights of admission.   * I certify that at the point of admission it is my clinical judgment that the patient will require inpatient hospital care spanning beyond 2 midnights from the point of admission due to high intensity of service, high risk for further deterioration and high frequency of surveillance required.*  Author: Alm Dorn Castor, MD 01/07/2024 12:47 PM  For on call review [email or web address].   This document was prepared using Dragon voice recognition software and may contain some unintended  transcription errors.

## 2024-01-07 NOTE — Telephone Encounter (Signed)
 Per Dr. Zelda Hickman patient has positive blood cultures and will need to go to the ED.  I have spoke to the patient and relayed results.  Patient verbalized understanding.  Harla Mensch Adel Holt, CMA

## 2024-01-07 NOTE — Telephone Encounter (Signed)
 Patient aware of positive blood cx and to go to the ED

## 2024-01-07 NOTE — Progress Notes (Signed)
 Pharmacy Antibiotic Note  Robert Madden is a 63 y.o. male who was seen by Dr. Dennise at [facility] on 01/06/24 for evaluation of fever and HA. Three of four blood culture bottles collected on 01/06/24 resulted back with [facility]. He was instructed to come to the ED on 01/07/24 for treatment with IV abx.  Pharmacy has been consulted to dose vancomycin  for bacteremia. Also some concern for septic emboli to the brain so will utilize nomogram dosing for vancomycin .   Today, 01/07/2024: - scr 1.17 (crcl~81) - wbc wnl   Plan: - vancomycin  2000 mg IV x1, then 1250 mg every 12 hours  - Monitor blood cultures, renal function and ability to narrow    Height: 6' (182.9 cm) Weight: 97.1 kg (214 lb) IBW/kg (Calculated) : 77.6  Temp (24hrs), Avg:98.8 F (37.1 C), Min:98.3 F (36.8 C), Max:99.2 F (37.3 C)  Recent Labs  Lab 01/07/24 1053  WBC 7.8  CREATININE 1.17  LATICACIDVEN 0.9    Estimated Creatinine Clearance: 81.1 mL/min (by C-G formula based on SCr of 1.17 mg/dL).    Allergies  Allergen Reactions   Atorvastatin     Other reaction(s): joint aches   Simvastatin Other (See Comments)    Joint aches   Ampicillin  Rash   Honey Rash     Thank you for allowing pharmacy to be a part of this patient's care.  Damien Quiet, PharmD, BCPS, BCIDP Infectious Diseases Clinical Pharmacist Phone: [phone] 01/07/2024 3:26 PM

## 2024-01-07 NOTE — Telephone Encounter (Signed)
 Positive blood Cx + 3/4 bottles [facility]: Needs to go to ED, please start IV vancomycin , obtain repeat blood Cx prior to starting antibiotics.Needs TTE, cbc, cmp. I called pt and went to voicemail.

## 2024-01-07 NOTE — ED Notes (Signed)
 ..ED TO INPATIENT HANDOFF REPORT  Name/Age/Gender Robert Madden Bis 63 y.o. male  Code Status    Code Status Orders  (From admission, onward)           Start     Ordered   01/07/24 1257  Full code  Continuous       Question:  By:  Answer:  Consent: discussion documented in EHR   01/07/24 1256           Code Status History     This patient has a current code status but no historical code status.       Home/SNF/Other Home  Chief Complaint Bacteremia [R78.81]  Level of Care/Admitting Diagnosis ED Disposition     ED Disposition  Admit   Condition  --   Comment  Hospital Area: [facility] [[id number]]  Level of Care: Med-Surg [16]  May admit patient to [name] or [name] if equivalent level of care is available:: No  Covid Evaluation: Asymptomatic - no recent exposure (last 10 days) testing not required  Diagnosis: Bacteremia [790.7.ICD-9-CM]  Admitting Physician: [name] [[id number]]  Attending Physician: [name] [[id number]]  Certification:: I certify this patient will need inpatient services for at least 2 midnights  Expected Medical Readiness: 01/10/2024          Medical History Past Medical History:  Diagnosis Date   Aortic insufficiency    Hyperlipidemia    IBS (irritable bowel syndrome)    Tachycardia, paroxysmal (HCC)     Allergies Allergies  Allergen Reactions   Atorvastatin     Other reaction(s): joint aches   Simvastatin Other (See Comments)    Joint aches   Ampicillin  Rash   Honey Rash    IV Location/Drains/Wounds Patient Lines/Drains/Airways Status     Active Line/Drains/Airways     Name Placement date Placement time Site Days   Peripheral IV 01/07/24 20 G Anterior;Distal;Left;Upper Arm 01/07/24  1145  Arm  less than 1            Labs/Imaging Results for orders placed or performed during the hospital encounter of 01/07/24 (from the past 48 hours)  CBC with Differential      Status: None   Collection Time: 01/07/24 10:53 AM  Result Value Ref Range   WBC 7.8 4.0 - 10.5 K/uL   RBC 4.63 4.22 - 5.81 MIL/uL   Hemoglobin 13.5 13.0 - 17.0 g/dL   HCT 59.7 60.9 - 47.9 %   MCV 86.8 80.0 - 100.0 fL   MCH 29.2 26.0 - 34.0 pg   MCHC 33.6 30.0 - 36.0 g/dL   RDW 87.1 88.4 - 84.4 %   Platelets 298 150 - 400 K/uL   nRBC 0.0 0.0 - 0.2 %   Neutrophils Relative % 71 %   Neutro Abs 5.5 1.7 - 7.7 K/uL   Lymphocytes Relative 20 %   Lymphs Abs 1.6 0.7 - 4.0 K/uL   Monocytes Relative 8 %   Monocytes Absolute 0.6 0.1 - 1.0 K/uL   Eosinophils Relative 1 %   Eosinophils Absolute 0.1 0.0 - 0.5 K/uL   Basophils Relative 0 %   Basophils Absolute 0.0 0.0 - 0.1 K/uL   Immature Granulocytes 0 %   Abs Immature Granulocytes 0.03 0.00 - 0.07 K/uL    Comment: Performed at [facility], [address] [place], [place] [address]  Basic metabolic panel     Status: Abnormal   Collection Time: 01/07/24 10:53 AM  Result Value Ref Range   Sodium 133 (L) 135 - 145 mmol/L   Potassium 4.2 3.5 - 5.1 mmol/L   Chloride 101 98 - 111 mmol/L   CO2 21 (L) 22 - 32 mmol/L   Glucose, Bld 96 70 - 99 mg/dL    Comment: Glucose reference range applies only to samples taken after fasting for at least 8 hours.   BUN 16 6 - 20 mg/dL   Creatinine, Ser 8.82 0.61 - 1.24 mg/dL   Calcium 9.0 8.9 - 89.6 mg/dL   GFR, Estimated >39 >39 mL/min    Comment: (NOTE) Calculated using the CKD-EPI Creatinine Equation (2021)    Anion gap 11 5 - 15    Comment: Performed at [facility] [address], [place], [place] [address]  Lactic acid, plasma     Status: None   Collection Time: 01/07/24 10:53 AM  Result Value Ref Range   Lactic Acid, Venous 0.9 0.5 - 1.9 mmol/L    Comment: Performed at [facility], [address], [place], [place] [address]  Hepatic function panel     Status: Abnormal   Collection Time: 01/07/24 10:53 AM  Result Value Ref Range   Total  Protein 7.7 6.5 - 8.1 g/dL   Albumin 3.6 3.5 - 5.0 g/dL   AST 33 15 - 41 U/L   ALT 63 (H) 0 - 44 U/L   Alkaline Phosphatase 70 38 - 126 U/L   Total Bilirubin 1.2 0.0 - 1.2 mg/dL   Bilirubin, Direct 0.2 0.0 - 0.2 mg/dL   Indirect Bilirubin 1.0 (H) 0.3 - 0.9 mg/dL    Comment: Performed at [facility], [address], [place], [place] [address]   US  Abdomen Limited RUQ (LIVER/GB) Result Date: 01/07/2024 CLINICAL DATA:  Transaminitis EXAM: ULTRASOUND ABDOMEN LIMITED RIGHT UPPER QUADRANT COMPARISON:  None Available. FINDINGS: Gallbladder: No gallstones or wall thickening visualized. No sonographic Murphy sign noted by sonographer. Common bile duct: Diameter: 2.5 mm Liver: No focal lesion identified. Within normal limits in parenchymal echogenicity. Portal vein is patent on color Doppler imaging with normal direction of blood flow towards the liver. Other: None. IMPRESSION: No cholelithiasis or sonographic evidence for acute cholecystitis. Electronically Signed   By: Bard Moats M.D.   On: 01/07/2024 16:29   ECHOCARDIOGRAM COMPLETE Result Date: 01/07/2024    ECHOCARDIOGRAM REPORT   Patient Name:   Robert Madden Date of Exam: 01/07/2024 Medical Rec #:  [id number]         Height:       72.0 in Accession #:    [id number]        Weight:       214.0 lb Date of Birth:  [birth year]         BSA:          2.193 m Patient Age:    62 years          BP:           122/73 mmHg Patient Gender: M                 HR:           75 bpm. Exam Location:  Inpatient Procedure: 2D Echo, Cardiac Doppler and Color Doppler Indications:    Bacteremia R78.81  History:        Patient has no prior history of Echocardiogram examinations.  Signs/Symptoms:Bacteremia.  Sonographer:    Lanell Maduro Referring Phys: [id number] DAVID MANUEL ORTIZ IMPRESSIONS  1. Left ventricular ejection fraction, by estimation, is 65 to 70%. The left ventricle has hyperdynamic function. The left ventricle has no regional wall  motion abnormalities. There is mild asymmetric left ventricular hypertrophy of the basal-septal segment. Left ventricular diastolic parameters were normal.  2. Right ventricular systolic function is normal. The right ventricular size is normal.  3. The mitral valve is normal in structure. Mild mitral valve regurgitation. No evidence of mitral stenosis.  4. Small mobile echodensity seen on the noncoronary cusp of the aortic valve, best seen in the parasternal long axis view. The aortic valve is normal in structure. Aortic valve regurgitation is mild. No aortic stenosis is present.  5. Aortic dilatation noted. 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  6. The inferior vena cava is normal in size with greater than 50% respiratory variability, suggesting right atrial pressure of 3 mmHg. Conclusion(s)/Recommendation(s): Cannot exclude small aortic valve vegetation. Consider TEE if clinically indicated. FINDINGS  Left Ventricle: Left ventricular ejection fraction, by estimation, is 65 to 70%. The left ventricle has hyperdynamic function. The left ventricle has no regional wall motion abnormalities. The left ventricular internal cavity size was normal in size. There is mild asymmetric left ventricular hypertrophy of the basal-septal segment. Left ventricular diastolic parameters were normal. Right Ventricle: The right ventricular size is normal. No increase in right ventricular wall thickness. Right ventricular systolic function is normal. Left Atrium: Left atrial size was normal in size. Right Atrium: Right atrial size was normal in size. Pericardium: There is no evidence of pericardial effusion. Mitral Valve: The mitral valve is normal in structure. Mild mitral valve regurgitation. No evidence of mitral valve stenosis. There is no evidence of mitral valve vegetation. Tricuspid Valve: The tricuspid valve is normal in structure.  Tricuspid valve regurgitation is not demonstrated. No evidence of tricuspid stenosis. Aortic Valve: Small mobile echodensity seen on the noncoronary cusp of the aortic valve, best seen in the parasternal long axis view. The aortic valve is normal in structure. Aortic valve regurgitation is mild. Aortic regurgitation PHT measures 562 msec.  No aortic stenosis is present. Pulmonic Valve: The pulmonic valve was normal in structure. Pulmonic valve regurgitation is not visualized. No evidence of pulmonic stenosis. Aorta: Aortic dilatation noted. 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 Venous: The inferior vena cava is normal in size with greater than 50% respiratory variability, suggesting right atrial pressure of 3 mmHg. IAS/Shunts: No atrial level shunt detected by color flow Doppler.  LEFT VENTRICLE PLAX 2D LVIDd:         4.60 cm     Diastology LVIDs:         2.70 cm     LV e' medial:    6.85 cm/s LV PW:         1.10 cm     LV E/e' medial:  12.6 LV IVS:        1.40 cm     LV e' lateral:   8.49 cm/s LVOT diam:     2.40 cm     LV E/e' lateral: 10.2 LV SV:         127 LV SV Index:   58 LVOT Area:     4.52 cm  LV Volumes (MOD)  LV vol d, MOD A2C: 68.0 ml LV vol d, MOD A4C: 80.0 ml LV vol s, MOD A2C: 20.8 ml LV vol s, MOD A4C: 23.1 ml LV SV MOD A2C:     47.2 ml LV SV MOD A4C:     80.0 ml LV SV MOD BP:      51.4 ml RIGHT VENTRICLE RV Basal diam:  3.20 cm RV S prime:     16.30 cm/s TAPSE (M-mode): 3.0 cm LEFT ATRIUM           Index        RIGHT ATRIUM           Index LA diam:      3.80 cm 1.73 cm/m   RA Area:     10.30 cm LA Vol (A2C): 65.6 ml 29.92 ml/m  RA Volume:   17.20 ml  7.84 ml/m LA Vol (A4C): 43.0 ml 19.61 ml/m  AORTIC VALVE LVOT Vmax:   144.00 cm/s LVOT Vmean:  101.000 cm/s LVOT VTI:    0.280 m AI PHT:      562 msec  AORTA Ao Root diam: 4.10 cm Ao Asc diam:  4.35 cm MITRAL VALVE MV Area (PHT): 3.60 cm    SHUNTS MV  Decel Time: 211 msec    Systemic VTI:  0.28 m MV E velocity: 86.60 cm/s  Systemic Diam: 2.40 cm MV A velocity: 60.90 cm/s MV E/A ratio:  1.42 Morene Brownie Electronically signed by Morene Brownie Signature Date/Time: 01/07/2024/4:05:19 PM    Final    DG Chest 2 View Result Date: 01/07/2024 CLINICAL DATA:  Bacteremia, fever EXAM: CHEST - 2 VIEW COMPARISON:  None Available. FINDINGS: The heart size and mediastinal contours are within normal limits. Both lungs are clear. No visible pleural effusions or pneumothorax. No acute osseous abnormality. IMPRESSION: No active cardiopulmonary disease. Electronically Signed   By: Gilmore GORMAN Molt M.D.   On: 01/07/2024 12:26    Pending Labs Unresulted Labs (From admission, onward)     Start     Ordered   01/08/24 0500  CBC  Tomorrow morning,   R        01/07/24 1256   01/08/24 0500  Comprehensive metabolic panel  Tomorrow morning,   R        01/07/24 1256   01/08/24 0500  Culture, blood (Routine X 2) w Reflex to ID Panel  BLOOD CULTURE X 2,   R (with TIMED occurrences)      01/07/24 1505   01/08/24 0500  Hepatitis B surface antibody,quantitative  Tomorrow morning,   R        01/07/24 1506   01/08/24 0500  Hepatitis A antibody, total  Tomorrow morning,   R        01/07/24 1506   01/07/24 1118  Hepatic function panel  Add-on,   AD        01/07/24 1117   01/07/24 1025  Lactic acid, plasma  (Lactic Acid)  Now then every 2 hours,   R (with STAT occurrences)      01/07/24 1024   01/07/24 1024  Blood culture (routine x 2)  BLOOD CULTURE X 2,   R (with STAT occurrences)      01/07/24 1024            Vitals/Pain Today's Vitals   01/07/24 1407 01/07/24 1500 01/07/24 1700 01/07/24 1752  BP: 123/73 114/68 126/71   Pulse: 78 74 71 72  Resp:  19 17   Temp: 98.3 F (  36.8 C)   98.4 F (36.9 C)  TempSrc: Oral  Oral Oral  SpO2: 96% 97% 97% 97%  Weight:      Height:      PainSc:        Isolation Precautions No active  isolations  Medications Medications  enoxaparin  (LOVENOX ) injection 40 mg (has no administration in time range)  acetaminophen  (TYLENOL ) tablet 650 mg (has no administration in time range)    Or  acetaminophen  (TYLENOL ) suppository 650 mg (has no administration in time range)  ondansetron  (ZOFRAN ) tablet 4 mg (has no administration in time range)    Or  ondansetron  (ZOFRAN ) injection 4 mg (has no administration in time range)  vancomycin  (VANCOREADY) IVPB 1250 mg/250 mL (has no administration in time range)  metoprolol  succinate (TOPROL -XL) 24 hr tablet 50 mg (has no administration in time range)  dextrose  5 %-0.9 % sodium chloride  infusion ( Intravenous New Bag/Given 01/07/24 1851)  gadobutrol  (GADAVIST ) 1 MMOL/ML injection 10 mL (has no administration in time range)  vancomycin  (VANCOREADY) IVPB 2000 mg/400 mL (0 mg Intravenous Stopped 01/07/24 1409)    Mobility walks

## 2024-01-07 NOTE — Consult Note (Addendum)
 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 Lucas Tachycardia on metoprolol    Plan: Vancomycin  to be continued--I had considered switch to dapto but we want CNS penetration and dapto is not good at that Follow-up culture data from the clinic as well as cultures from inpatient world with the latter having the advantage that [employer] will allow us  to identify the organism more quickly 2D echocardiogram and he will need a transesophageal echocardiogram.  I called Trish with cardiology and she is getting him on the schedule for [facility] vancomycin  for daptomycinonday We will need to repeat blood cultures tomorrow to make sure he is clearing his bacteremia Order an MRI of the brain to evaluate for septic emboli and/or brain abscess I am also ordering an ultrasound of the right upper quadrant to evaluate his liver given the transaminitis We will ensure that he is screened for HIV and viral hepatitides if not already done   Dr. Efrain will check in on the patient over the weekend.  Principal Problem:   Bacteremia   Scheduled Meds:  enoxaparin  (LOVENOX ) injection  40 mg Subcutaneous Q24H   Continuous Infusions:  [START ON 01/08/2024] vancomycin      PRN Meds:.acetaminophen  **OR** acetaminophen , ondansetron  **OR** ondansetron  (ZOFRAN ) IV  HPI: Robert Madden is a 63 y.o. male with past medical history significant for aortic aneurysm followed by Dr. Sherrine  supraventricular tachycardia followed by EP on metoprolol  who also has IBS who was seen by my partner Dr. Dennise yesterday to evaluate a several week history of subjective fevers as well as objective fevers as high as 102.4 which began happening on January 29.  In addition to fevers he has had problems with headaches typically with the fevers more so on the left side and also at times accompanied by blurry vision.  He is not have much in the way of other focal symptoms that he is complaining of.  He did remember today when his son was visiting him in the ER that he had suffered from a soft tissue infection involving a finger on his left hand where he had erythema and later overt purulence which he managed with topical therapies including topical antibiotics but no systemic antibiotics.  Patient lab work is done by PCP was remarkable for elevation in transaminases AST ALT to 55 and out of 4 with slight elevation of bilirubin to 1.2 negative COVID and flu test negative Lyme and Ehrlichia panels.  He was seen by my partner Dr. Dennise yesterday who ordered blood cultures.  Blood cultures essentially come back with gram-positive cocci and he has been asked to come to the ER for admission and IV antibiotics and workup for endocarditis.  He has had repeat blood cultures taken and has been started on IV vancomycin .  He still has headaches from time to time more so when he has the fevers other than this he really has no other focal signs he has had a bit  of a chronic cough after he recovered from COVID in October but is fairly minimal.  I have personally spent 90 minutes involved in face-to-face and non-face-to-face activities for this patient on the day of the visit. Professional time spent includes the following activities: Preparing to see the patient (review of tests), Obtaining and/or reviewing separately obtained history (admission/discharge record), Performing a medically appropriate examination and/or  evaluation , Ordering medications/tests/procedures, referring and communicating with other health care professionals, Documenting clinical information in the EMR, Independently interpreting results (not separately reported), Communicating results to the patient/family/caregiver, Counseling and educating the patient/family/caregiver and Care coordination (not separately reported).   Evaluation of the patient requires complex antimicrobial therapy evaluation, counseling , isolation needs to reduce disease transmission and risk assessment and mitigation.     Review of Systems: Review of Systems  Constitutional:  Positive for chills and fever. Negative for malaise/fatigue and weight loss.  HENT:  Negative for congestion and sore throat.   Eyes:  Positive for blurred vision. Negative for photophobia.  Respiratory:  Negative for cough, shortness of breath and wheezing.   Cardiovascular:  Negative for chest pain, palpitations and leg swelling.  Gastrointestinal:  Negative for abdominal pain, blood in stool, constipation, diarrhea, heartburn, melena, nausea and vomiting.  Genitourinary:  Negative for dysuria, flank pain and hematuria.  Musculoskeletal:  Negative for back pain, falls, joint pain and myalgias.  Skin:  Negative for itching and rash.  Neurological:  Positive for headaches. Negative for dizziness, focal weakness, loss of consciousness and weakness.  Endo/Heme/Allergies:  Does not bruise/bleed easily.  Psychiatric/Behavioral:  Negative for depression and suicidal ideas. The patient does not have insomnia.     Past Medical History:  Diagnosis Date   Aortic insufficiency    Hyperlipidemia    IBS (irritable bowel syndrome)    Tachycardia, paroxysmal (HCC)     Social History   Tobacco Use   Smoking status: Never   Smokeless tobacco: Never  Vaping Use   Vaping status: Never Used  Substance Use Topics   Alcohol use: No   Drug use: No    Family History  Problem Relation Age of  Onset   Hypertension Mother    Heart attack Neg Hx    Stroke Neg Hx    Allergies  Allergen Reactions   Atorvastatin     Other reaction(s): joint aches   Simvastatin Other (See Comments)    Joint aches   Ampicillin  Rash   Honey Rash    OBJECTIVE: Blood pressure 123/73, pulse 78, temperature 98.3 F (36.8 C), temperature source Oral, resp. rate 20, height 6' (1.829 m), weight 97.1 kg, SpO2 96%.  Physical Exam Constitutional:      Appearance: He is well-developed.  HENT:     Head: Normocephalic and atraumatic.     Mouth/Throat:     Lips: Pink.     Mouth: Mucous membranes are moist.     Dentition: Normal dentition. Does not have dentures. No dental tenderness, gingival swelling, dental caries, dental abscesses or gum lesions.     Tongue: No lesions. Tongue does not deviate from midline.     Palate: No mass and lesions.     Pharynx: Oropharynx is clear. Uvula midline. No pharyngeal swelling, oropharyngeal exudate, posterior oropharyngeal erythema, uvula swelling or postnasal drip.     Tonsils: No tonsillar exudate.  Eyes:     Conjunctiva/sclera: Conjunctivae normal.  Cardiovascular:     Rate and Rhythm: Normal rate and regular rhythm.     Heart sounds:  No murmur heard.    No friction rub. No gallop.  Pulmonary:     Effort: Pulmonary effort is normal. No respiratory distress.     Breath sounds: No stridor. No wheezing or rhonchi.  Abdominal:     General: There is no distension.     Palpations: Abdomen is soft.  Musculoskeletal:        General: No tenderness. Normal range of motion.     Cervical back: Normal range of motion and neck supple.  Skin:    General: Skin is warm and dry.     Coloration: Skin is not pale.     Findings: No erythema or rash.  Neurological:     General: No focal deficit present.     Mental Status: He is alert and oriented to person, place, and time.  Psychiatric:        Mood and Affect: Mood normal.        Behavior: Behavior normal.         Thought Content: Thought content normal.        Judgment: Judgment normal.    No splinter hemorrhages Janeway lesions or Osler's nodes observed Lab Results Lab Results  Component Value Date   WBC 7.8 01/07/2024   HGB 13.5 01/07/2024   HCT 40.2 01/07/2024   MCV 86.8 01/07/2024   PLT 298 01/07/2024    Lab Results  Component Value Date   CREATININE 1.17 01/07/2024   BUN 16 01/07/2024   NA 133 (L) 01/07/2024   K 4.2 01/07/2024   CL 101 01/07/2024   CO2 21 (L) 01/07/2024    Lab Results  Component Value Date   ALT 63 (H) 01/07/2024   AST 33 01/07/2024   ALKPHOS 70 01/07/2024   BILITOT 1.2 01/07/2024     Microbiology: Recent Results (from the past 240 hours)  Culture, blood (single) w Reflex to ID Panel     Status: Abnormal (Preliminary result)   Collection Time: 01/06/24 10:43 AM   Specimen: Blood  Result Value Ref Range Status   MICRO NUMBER: [id number]  Preliminary   SPECIMEN QUALITY: Adequate  Preliminary   Source BLOOD 1  Preliminary   STATUS: PRELIMINARY  Preliminary   Result: Pos 7.7  Preliminary   ISOLATE 1: Gram positive cocci isolated (AA)  Preliminary    Comment: Gram positive cocci isolated , from anaerobic bottle only   COMMENT: Aerobic and anaerobic bottle received.  Preliminary  Culture, blood (single) w Reflex to ID Panel     Status: Abnormal (Preliminary result)   Collection Time: 01/06/24 10:46 AM   Specimen: Blood  Result Value Ref Range Status   MICRO NUMBER: [id number]  Preliminary   SPECIMEN QUALITY: Adequate  Preliminary   Source BLOOD 2  Preliminary   STATUS: PRELIMINARY  Preliminary   ISOLATE 1: Gram positive cocci isolated (AA)  Preliminary    Comment: Gram positive cocci isolated , from aerobic and anaerobic bottles   COMMENT: Aerobic and anaerobic bottle received.  Preliminary    Jomarie Fleeta Rothman, MD [facility] [phone] pager  01/07/2024, 2:49 PM

## 2024-01-07 NOTE — ED Notes (Signed)
 Pt going to MRI

## 2024-01-07 NOTE — ED Provider Notes (Signed)
 [name] Provider Note   CSN: [id number] Arrival date & time: 01/07/24  1013     History  Chief Complaint  Patient presents with   Fever    Robert Madden is a 63 y.o. male.   Fever 64 year old male history of aortic insufficiency, hyperlipidemia presenting for possible cultures.  Patient states starting about 2 and half to 3 weeks ago he developed intermittent fevers Tmax of 102.4, frontal headache.  He had a couple episodes double vision throughout this but none today and currently has no vision changes or weakness or numbness.  Headache is improved.  No neck pain or stiffness or posterior headache.  He is not having cough or URI symptoms or fevers or chills.  No vomiting or diarrhea or abdominal pain.  No urinary symptoms.  No rash.  Has not had any recent dental issues.  No IV drug use.  Patient was seen by infectious ease yesterday and had blood cultures, 3-4 bottles were positive for gram-positive cocci.  Sent to ED for evaluation.     Home Medications Prior to Admission medications   Medication Sig Start Date End Date Taking? Authorizing Provider  doxycycline (VIBRA-TABS) 100 MG tablet 1 tablet with food Orally Twice a day for 10 days 12/30/23   [provider]  hyoscyamine (ANASPAZ) 0.125 MG TBDP disintergrating tablet Place 0.125 mg under the tongue every 4 (four) hours as needed (reflux).  Patient not taking: Reported on 01/06/2024    [provider]  ibuprofen (ADVIL) 200 MG tablet 2-3 tablet as needed Orally every 6 hrs    [provider]  metoprolol  succinate (TOPROL -XL) 50 MG 24 hr tablet Take 1 tablet (50 mg total) by mouth daily. 05/25/23   Aniceto Daphne CROME, NP  pravastatin  (PRAVACHOL ) 40 MG tablet takes 40 mg every day Patient not taking: Reported on 01/06/2024 08/15/15   [provider]      Allergies    Atorvastatin, Simvastatin, Ampicillin , and Honey    Review of Systems   Review  of Systems  Constitutional:  Positive for fever.  Review of systems completed and notable as per HPI.  ROS otherwise negative.   Physical Exam Updated Vital Signs BP 123/73   Pulse 78   Temp 98.3 F (36.8 C) (Oral)   Resp 20   Ht 6' (1.829 m)   Wt 97.1 kg   SpO2 96%   BMI 29.02 kg/m  Physical Exam Vitals and nursing note reviewed.  Constitutional:      General: He is not in acute distress.    Appearance: He is well-developed.  HENT:     Head: Normocephalic and atraumatic.     Mouth/Throat:     Mouth: Mucous membranes are moist.     Pharynx: Oropharynx is clear.  Eyes:     Extraocular Movements: Extraocular movements intact.     Conjunctiva/sclera: Conjunctivae normal.     Pupils: Pupils are equal, round, and reactive to light.  Cardiovascular:     Rate and Rhythm: Normal rate and regular rhythm.     Heart sounds: Normal heart sounds. No murmur heard. Pulmonary:     Effort: Pulmonary effort is normal. No respiratory distress.     Breath sounds: Normal breath sounds.  Abdominal:     Palpations: Abdomen is soft.     Tenderness: There is no abdominal tenderness.  Musculoskeletal:        General: No swelling.     Cervical back: Normal  range of motion and neck supple. No rigidity or tenderness.     Right lower leg: No edema.     Left lower leg: No edema.  Skin:    General: Skin is warm and dry.     Capillary Refill: Capillary refill takes less than 2 seconds.  Neurological:     General: No focal deficit present.     Mental Status: He is alert and oriented to person, place, and time. Mental status is at baseline.     Cranial Nerves: No cranial nerve deficit.     Sensory: No sensory deficit.     Motor: No weakness.     Coordination: Coordination normal.  Psychiatric:        Mood and Affect: Mood normal.     ED Results / Procedures / Treatments   Labs (all labs ordered are listed, but only abnormal results are displayed) Labs Reviewed  BASIC METABOLIC PANEL -  Abnormal; Notable for the following components:      Result Value   Sodium 133 (*)    CO2 21 (*)    All other components within normal limits  HEPATIC FUNCTION PANEL - Abnormal; Notable for the following components:   ALT 63 (*)    Indirect Bilirubin 1.0 (*)    All other components within normal limits  CULTURE, BLOOD (ROUTINE X 2)  CULTURE, BLOOD (ROUTINE X 2)  CBC WITH DIFFERENTIAL/PLATELET  LACTIC ACID, PLASMA  LACTIC ACID, PLASMA  HEPATIC FUNCTION PANEL    EKG None  Radiology DG Chest 2 View Result Date: 01/07/2024 CLINICAL DATA:  Bacteremia, fever EXAM: CHEST - 2 VIEW COMPARISON:  None Available. FINDINGS: The heart size and mediastinal contours are within normal limits. Both lungs are clear. No visible pleural effusions or pneumothorax. No acute osseous abnormality. IMPRESSION: No active cardiopulmonary disease. Electronically Signed   By: Gilmore GORMAN Molt M.D.   On: 01/07/2024 12:26    Procedures Procedures    Medications Ordered in ED Medications  enoxaparin  (LOVENOX ) injection 40 mg (has no administration in time range)  acetaminophen  (TYLENOL ) tablet 650 mg (has no administration in time range)    Or  acetaminophen  (TYLENOL ) suppository 650 mg (has no administration in time range)  ondansetron  (ZOFRAN ) tablet 4 mg (has no administration in time range)    Or  ondansetron  (ZOFRAN ) injection 4 mg (has no administration in time range)  vancomycin  (VANCOREADY) IVPB 1250 mg/250 mL (has no administration in time range)  vancomycin  (VANCOREADY) IVPB 2000 mg/400 mL (0 mg Intravenous Stopped 01/07/24 1409)    ED Course/ Medical Decision Making/ A&P                                 Medical Decision Making Amount and/or Complexity of Data Reviewed Labs: ordered. Radiology: ordered.  Risk Prescription drug management. Decision regarding hospitalization.   Medical Decision Making:   Robert Madden is a 63 y.o. male who presented to the ED today with fever,  headache, possible culture.  All signs reviewed.  Exam he is well-appearing.  Does not appear to be septic.  He has 2 positive blood cultures from yesterday, will repeat and treat empirically for vancomycin  per infectious disease note.  Will repeat lab work as well and obtain chest x-ray.  No other localizing signs of infection.  Mild frontal headache but no signs of meningitis some time course is not consistent bacterial meningitis.   Patient placed on continuous  vitals and telemetry monitoring while in ED which was reviewed periodically.  Reviewed and confirmed nursing documentation for past medical history, family history, social history.  Reassessment and Plan:   Labs notable for bili elevated ALT and sodium 133.  Chest x-ray is unremarkable.  Discussed with hospitalist and admitted for management of bacteremia.   Patient's presentation is most consistent with acute complicated illness / injury requiring diagnostic workup.           Final Clinical Impression(s) / ED Diagnoses Final diagnoses:  Bacteremia    Rx / DC Orders ED Discharge Orders     None         Nicholaus Cassondra DEL, MD 01/07/24 1547

## 2024-01-07 NOTE — Plan of Care (Signed)

## 2024-01-07 NOTE — ED Triage Notes (Addendum)
 Patient stated he has had a fever on and off for [redacted] weeks along with a headache. Had double vision a few days that has resolved. Received a call today stating he has a blood infection/positive blood cultures and to come to the ED for antibiotics. Is not sensitive to light.

## 2024-01-08 DIAGNOSIS — R7881 Bacteremia: Secondary | ICD-10-CM | POA: Diagnosis not present

## 2024-01-08 DIAGNOSIS — I38 Endocarditis, valve unspecified: Secondary | ICD-10-CM

## 2024-01-08 LAB — BLOOD CULTURE ID PANEL (REFLEXED) - BCID2

## 2024-01-08 LAB — CBC
HCT: 36.9 % — ABNORMAL LOW (ref 39.0–52.0)
Hemoglobin: 12.3 g/dL — ABNORMAL LOW (ref 13.0–17.0)
MCH: 29.5 pg (ref 26.0–34.0)
MCHC: 33.3 g/dL (ref 30.0–36.0)
MCV: 88.5 fL (ref 80.0–100.0)
Platelets: 258 10*3/uL (ref 150–400)
RBC: 4.17 MIL/uL — ABNORMAL LOW (ref 4.22–5.81)
RDW: 13 % (ref 11.5–15.5)
WBC: 7.1 10*3/uL (ref 4.0–10.5)
nRBC: 0 % (ref 0.0–0.2)

## 2024-01-08 LAB — COMPREHENSIVE METABOLIC PANEL
ALT: 58 U/L — ABNORMAL HIGH (ref 0–44)
AST: 29 U/L (ref 15–41)
Albumin: 3.5 g/dL (ref 3.5–5.0)
Alkaline Phosphatase: 64 U/L (ref 38–126)
Anion gap: 9 (ref 5–15)
BUN: 17 mg/dL (ref 6–20)
CO2: 21 mmol/L — ABNORMAL LOW (ref 22–32)
Calcium: 8.7 mg/dL — ABNORMAL LOW (ref 8.9–10.3)
Chloride: 104 mmol/L (ref 98–111)
Creatinine, Ser: 1.03 mg/dL (ref 0.61–1.24)
GFR, Estimated: >60 mL/min (ref >60)
Glucose, Bld: 116 mg/dL — ABNORMAL HIGH (ref 70–99)
Potassium: 4 mmol/L (ref 3.5–5.1)
Sodium: 134 mmol/L — ABNORMAL LOW (ref 135–145)
Total Bilirubin: 0.9 mg/dL (ref 0.0–1.2)
Total Protein: 7.4 g/dL (ref 6.5–8.1)

## 2024-01-08 LAB — HEPATITIS A ANTIBODY, TOTAL: hep A Total Ab: NONREACTIVE

## 2024-01-08 MED ORDER — DIPHENHYDRAMINE HCL 25 MG PO CAPS: 25.0000 mg | ORAL_CAPSULE | Freq: Four times a day (QID) | ORAL | Status: DC | PRN

## 2024-01-08 MED ORDER — DIPHENHYDRAMINE HCL 25 MG PO CAPS
25.0000 mg | ORAL_CAPSULE | Freq: Once | ORAL | Status: AC
  Administered 2024-01-08: 25 mg via ORAL
  Filled 2024-01-08: qty 1

## 2024-01-08 NOTE — Progress Notes (Addendum)
 PROGRESS NOTE    Robert Madden  [name] DOB: [birth year] DOA: 01/07/2024 PCP: [name], [name], MD (Inactive)  Chief Complaint  Patient presents with   Fever    Brief Narrative:    Robert Madden is Robert Madden 63 y.o. male with medical history significant of aortic insufficiency, aortic root dilation, thoracic aortic aneurysm, hyperlipidemia, IBS, paroxysmal tachycardia who has been having fever associated with malaise for the past 3 weeks and has been referred by ID to the emergency department for admission due to blood cultures growing gram-positive bacteria.   Assessment & Plan:   Principal Problem:   Bacteremia Active Problems:   Aortic aneurysm, thoracic (HCC)   SVT (supraventricular tachycardia) (HCC)   Hypercholesterolemia without hypertriglyceridemia   Abnormal LFTs   Hyponatremia   Hypocarbia  Enterococcus Faecalis Bacteremia Concern for Endocarditis Blood culture 2/7 with enterococcus faecalis on BCID -> cultures pending Repeat 2/8 cultures pending  Echo with small mobile echodensity on noncoronary cusp of aortic valve - can't excluded small aortic valve vegetation Planning for TEE, sounds like Monday 2/10 MRI brain without evidence for brain abscess or CNS infection Appreciate ID assistance  Incidental Stroke  MRI brain with single punctate focus of diffusion signal abnormality involving the high left frontal lobe, concerning for tiny acute to subacute ischemic infarct Will discuss with neurology, doesn't sounds like he's had clear neuro symptoms (maybe double vision Kerem Gilmer week or so ago)  Elevated LFT's  Mild RUQ US  without cholelithiasis or cholecystitis  Negative hep [name] pending  Aortic Aneurysm Outpatient follow up  SVT Metoprolol     DVT prophylaxis: lovenox  Code Status: full Family Communication: wife at bedside Disposition:   Status is: Inpatient Remains inpatient appropriate because: need for continued inpatient care    Consultants:  ID  Procedures:  Echo IMPRESSIONS     1. Left ventricular ejection fraction, by estimation, is 65 to 70%. The  left ventricle has hyperdynamic function. The left ventricle has no  regional wall motion abnormalities. There is mild asymmetric left  ventricular hypertrophy of the basal-septal  segment. Left ventricular diastolic parameters were normal.   2. Right ventricular systolic function is normal. The right ventricular  size is normal.   3. The mitral valve is normal in structure. Mild mitral valve  regurgitation. No evidence of mitral stenosis.   4. Small mobile echodensity seen on the noncoronary cusp of the aortic  valve, best seen in the parasternal long axis view. The aortic valve is  normal in structure. Aortic valve regurgitation is mild. No aortic  stenosis is present.   5. Aortic dilatation noted. There is moderate dilatation of the aortic  root, measuring 43 mm. There is moderate dilatation of the ascending  aorta, measuring 44 mm.   6. The inferior vena cava is normal in size with greater than 50%  respiratory variability, suggesting right atrial pressure of 3 mmHg.   Conclusion(s)/Recommendation(s): Cannot exclude small aortic valve  vegetation. Consider TEE if clinically indicated.   Antimicrobials:  Anti-infectives (From admission, onward)    Start     Dose/Rate Route Frequency Ordered Stop   01/08/24 0100  vancomycin  (VANCOCIN ) IVPB 1000 mg/200 mL premix  Status:  Discontinued        1,000 mg 200 mL/hr over 60 Minutes Intravenous Every 12 hours 01/07/24 1159 01/07/24 1515   01/07/24 2300  DAPTOmycin  (CUBICIN ) 800 mg in sodium chloride  0.9 % IVPB  Status:  Discontinued        8  mg/kg  97.1 kg 132 mL/hr over 30 Minutes Intravenous Daily 01/07/24 1515 01/07/24 1522   01/07/24 2300  DAPTOmycin  (CUBICIN ) IVPB 700 mg/127mL premix  Status:  Discontinued        8 mg/kg  85.4 kg (Adjusted) 200 mL/hr over 30 Minutes Intravenous Daily 01/07/24 1522  01/07/24 1525   01/07/24 2300  vancomycin  (VANCOREADY) IVPB 1250 mg/250 mL        1,250 mg 166.7 mL/hr over 90 Minutes Intravenous Every 12 hours 01/07/24 1528     01/07/24 1145  vancomycin  (VANCOREADY) IVPB 2000 mg/400 mL        2,000 mg 200 mL/hr over 120 Minutes Intravenous  Once 01/07/24 1134 01/07/24 1409       Subjective: Wife at bedside No new complaints - had rash overnight  Objective: Vitals:   01/07/24 1752 01/07/24 2044 01/08/24 0113 01/08/24 0908  BP:  127/77 (!) 107/54 115/64  Pulse: 72 76 78 81  Resp:   20 17  Temp: 98.4 F (36.9 C) 99.9 F (37.7 C) 100.2 F (37.9 C) 100 F (37.8 C)  TempSrc: Oral Oral Oral Oral  SpO2: 97% 100% 97% 96%  Weight:      Height:        Intake/Output Summary (Last 24 hours) at 01/08/2024 1126 Last data filed at 01/08/2024 0300 Gross per 24 hour  Intake 1324.4 ml  Output --  Net 1324.4 ml   Filed Weights   01/07/24 1019  Weight: 97.1 kg    Examination:  General exam: Appears calm and comfortable  Respiratory system: Clear to auscultation. Respiratory effort normal. Cardiovascular system: S1 & S2 heard, RRR.  Gastrointestinal system: Abdomen is nondistended, soft and nontender Diffuse Maculopapular rash to back with some evidence of excoriation Central nervous system: Alert and oriented. No focal neurological deficits. Extremities: no LEE   Data Reviewed: I have personally reviewed following labs and imaging studies  CBC: Recent Labs  Lab 01/07/24 1053 01/08/24 0425  WBC 7.8 7.1  NEUTROABS 5.5  --   HGB 13.5 12.3*  HCT 40.2 36.9*  MCV 86.8 88.5  PLT 298 258    Basic Metabolic Panel: Recent Labs  Lab 01/07/24 1053 01/08/24 0425  NA 133* 134*  K 4.2 4.0  CL 101 104  CO2 21* 21*  GLUCOSE 96 116*  BUN 16 17  CREATININE 1.17 1.03  CALCIUM 9.0 8.7*    GFR: Estimated Creatinine Clearance: 92.1 mL/min (by C-G formula based on SCr of 1.03 mg/dL).  Liver Function Tests: Recent Labs  Lab 01/07/24 1053  01/07/24 2136 01/08/24 0425  AST 33 33 29  ALT 63* 61* 58*  ALKPHOS 70 68 64  BILITOT 1.2 0.8 0.9  PROT 7.7 7.5 7.4  ALBUMIN 3.6 3.4* 3.5    CBG: No results for input(s): GLUCAP in the last 168 hours.   Recent Results (from the past 240 hours)  Culture, blood (single) w Reflex to ID Panel     Status: Abnormal (Preliminary result)   Collection Time: 01/06/24 10:43 AM   Specimen: Blood  Result Value Ref Range Status   MICRO NUMBER: [id number]  Preliminary   SPECIMEN QUALITY: Suboptimal  Preliminary   Source BLOOD 1  Preliminary   STATUS: PRELIMINARY  Preliminary   Result:   Preliminary    Pos 23.2 Inspection of blood culture bottles indicates that an inadequate volume of blood may have been collected for the detection of sepsis.   ISOLATE 2: Gram positive cocci isolated (AA)  Preliminary  Comment: Gram positive cocci isolated , from aerobic and anaerobic bottles   COMMENT: Aerobic and anaerobic bottle received.  Preliminary  Culture, blood (single) w Reflex to ID Panel     Status: Abnormal (Preliminary result)   Collection Time: 01/06/24 10:46 AM   Specimen: Blood  Result Value Ref Range Status   MICRO NUMBER: [id number]  Preliminary   SPECIMEN QUALITY: Adequate  Preliminary   Source BLOOD 2  Preliminary   STATUS: PRELIMINARY  Preliminary   ISOLATE 1: Gram positive cocci isolated (AA)  Preliminary    Comment: Gram positive cocci isolated , from aerobic and anaerobic bottles   COMMENT: Aerobic and anaerobic bottle received.  Preliminary  Blood culture (routine x 2)     Status: None (Preliminary result)   Collection Time: 01/07/24 10:51 AM   Specimen: BLOOD  Result Value Ref Range Status   Specimen Description   Final    BLOOD LEFT ANTECUBITAL Performed at [facility], [address] [place], [place] [address]    Special Requests   Final    BOTTLES DRAWN AEROBIC AND ANAEROBIC Blood Culture adequate volume Performed at [facility],  [address] [place], [place] [address]    Culture  Setup Time   Final    GRAM POSITIVE COCCI IN BOTH AEROBIC AND ANAEROBIC BOTTLES CRITICAL RESULT CALLED TO, READ BACK BY AND VERIFIED WITH: PHARMD C SHADE 2/0/2025 @ 0725 BY AB Performed at [facility], [address] [place] [address], [place], [place] [address]    Culture GRAM POSITIVE COCCI  Final   Report Status PENDING  Incomplete  Blood Culture ID Panel (Reflexed)     Status: Abnormal   Collection Time: 01/07/24 10:51 AM  Result Value Ref Range Status   Enterococcus faecalis DETECTED (Ric Rosenberg) NOT DETECTED Final    Comment: CRITICAL RESULT CALLED TO, READ BACK BY AND VERIFIED WITH: PHARMD C SHADE 2/0/2025 @ 0725 BY AB    Enterococcus Faecium NOT DETECTED NOT DETECTED Final   Listeria monocytogenes NOT DETECTED NOT DETECTED Final   Staphylococcus species NOT DETECTED NOT DETECTED Final   Staphylococcus aureus (BCID) NOT DETECTED NOT DETECTED Final   Staphylococcus epidermidis NOT DETECTED NOT DETECTED Final   Staphylococcus lugdunensis NOT DETECTED NOT DETECTED Final   Streptococcus species NOT DETECTED NOT DETECTED Final   Streptococcus agalactiae NOT DETECTED NOT DETECTED Final   Streptococcus pneumoniae NOT DETECTED NOT DETECTED Final   Streptococcus pyogenes NOT DETECTED NOT DETECTED Final   Anabia Weatherwax.calcoaceticus-baumannii NOT DETECTED NOT DETECTED Final   Bacteroides fragilis NOT DETECTED NOT DETECTED Final   Enterobacterales NOT DETECTED NOT DETECTED Final   Enterobacter cloacae complex NOT DETECTED NOT DETECTED Final   Escherichia coli NOT DETECTED NOT DETECTED Final   Klebsiella aerogenes NOT DETECTED NOT DETECTED Final   Klebsiella oxytoca NOT DETECTED NOT DETECTED Final   Klebsiella pneumoniae NOT DETECTED NOT DETECTED Final   Proteus species NOT DETECTED NOT DETECTED Final   Salmonella species NOT DETECTED NOT DETECTED Final   Serratia marcescens NOT DETECTED NOT DETECTED Final   Haemophilus influenzae NOT DETECTED NOT DETECTED  Final   Neisseria meningitidis NOT DETECTED NOT DETECTED Final   Pseudomonas aeruginosa NOT DETECTED NOT DETECTED Final   Stenotrophomonas maltophilia NOT DETECTED NOT DETECTED Final   Candida albicans NOT DETECTED NOT DETECTED Final   Candida auris NOT DETECTED NOT DETECTED Final   Candida glabrata NOT DETECTED NOT DETECTED Final   Candida krusei NOT DETECTED NOT DETECTED Final   Candida parapsilosis  NOT DETECTED NOT DETECTED Final   Candida tropicalis NOT DETECTED NOT DETECTED Final   Cryptococcus neoformans/gattii NOT DETECTED NOT DETECTED Final   Vancomycin  resistance NOT DETECTED NOT DETECTED Final    Comment: Performed at [facility], [address] [place], [place] [address]  Blood culture (routine x 2)     Status: None (Preliminary result)   Collection Time: 01/07/24 11:25 AM   Specimen: BLOOD  Result Value Ref Range Status   Specimen Description   Final    BLOOD LEFT ANTECUBITAL Performed at [facility], [address] [place], [place] [address]    Special Requests   Final    BOTTLES DRAWN AEROBIC AND ANAEROBIC Blood Culture adequate volume Performed at [facility], [address] [place], [place] [address]    Culture  Setup Time   Final    GRAM POSITIVE COCCI IN BOTH AEROBIC AND ANAEROBIC BOTTLES CRITICAL VALUE NOTED.  VALUE IS CONSISTENT WITH PREVIOUSLY REPORTED AND CALLED VALUE. Performed at [facility], [address], [place], [place] [address]    Culture GRAM POSITIVE COCCI  Final   Report Status PENDING  Incomplete         Radiology Studies: MR BRAIN W WO CONTRAST Result Date: 01/07/2024 CLINICAL DATA:  Initial evaluation for brain abscess, gram-positive bacteremia, subacute endocarditis, headaches. EXAM: MRI HEAD WITHOUT AND WITH CONTRAST TECHNIQUE: Multiplanar, multiecho pulse sequences of the brain and surrounding structures were obtained without and with intravenous contrast. CONTRAST:  10mL GADAVIST   GADOBUTROL  1 MMOL/ML IV SOLN COMPARISON:  Prior head CT from 05/30/2009. FINDINGS: Brain: Cerebral volume within normal limits. Few scattered subcentimeter foci of T2/FLAIR hyperintensity noted involving the periventricular deep white matter of both cerebral hemispheres, most pronounced about the frontal lobes, nonspecific, but most commonly related to mild chronic microvascular ischemic disease. Changes are mild in nature. Single punctate focus of diffusion signal abnormality seen involving the high left frontal lobe (series 5, image 47), suspicious for Stephfon Bovey tiny acute to subacute ischemic infarct. No associated hemorrhage. No other evidence for acute or subacute ischemia. Gray-white matter adjacent otherwise maintained. No acute or chronic intracranial blood products. No mass lesion, midline shift or mass effect. No hydrocephalus or extra-axial fluid collection. Pituitary gland suprasellar region within normal limits. No abnormal enhancement. No evidence for brain abscess or other CNS infection. Vascular: Major intracranial vascular flow voids are maintained. Skull and upper cervical spine: Craniocervical junction within normal limits. Bone marrow signal intensity normal. No scalp soft tissue abnormality. Sinuses/Orbits: Globes orbital soft tissues within normal limits. Mild scattered mucosal thickening noted about the ethmoidal air cells. Paranasal sinuses are otherwise clear. Trace right mastoid effusion noted, of doubtful significance. Other: None. IMPRESSION: 1. Single punctate focus of diffusion signal abnormality involving the high left frontal lobe, suspicious for Elvia Aydin tiny acute to subacute ischemic infarct. No associated hemorrhage. 2. No other acute intracranial abnormality. No evidence for brain abscess or other CNS infection. 3. Underlying mild cerebral white matter disease, nonspecific, but most commonly related to chronic microvascular ischemic disease. Electronically Signed   By: Morene Hoard M.D.    On: 01/07/2024 20:47   US  Abdomen Limited RUQ (LIVER/GB) Result Date: 01/07/2024 CLINICAL DATA:  Transaminitis EXAM: ULTRASOUND ABDOMEN LIMITED RIGHT UPPER QUADRANT COMPARISON:  None Available. FINDINGS: Gallbladder: No gallstones or wall thickening visualized. No sonographic Murphy sign noted by sonographer. Common bile duct: Diameter: 2.5 mm Liver: No focal lesion identified. Within normal limits in parenchymal echogenicity. Portal vein  is patent on color Doppler imaging with normal direction of blood flow towards the liver. Other: None. IMPRESSION: No cholelithiasis or sonographic evidence for acute cholecystitis. Electronically Signed   By: Bard Moats M.D.   On: 01/07/2024 16:29   ECHOCARDIOGRAM COMPLETE Result Date: 01/07/2024    ECHOCARDIOGRAM REPORT   Patient Name:   Robert Madden Date of Exam: 01/07/2024 Medical Rec #:  [id number]         Height:       72.0 in Accession #:    [id number]        Weight:       214.0 lb Date of Birth:  17-Aug-1961         BSA:          2.193 m Patient Age:    62 years          BP:           122/73 mmHg Patient Gender: M                 HR:           75 bpm. Exam Location:  Inpatient Procedure: 2D Echo, Cardiac Doppler and Color Doppler Indications:    Bacteremia R78.81  History:        Patient has no prior history of Echocardiogram examinations.                 Signs/Symptoms:Bacteremia.  Sonographer:    Lanell Maduro Referring Phys: [id number] DAVID MANUEL ORTIZ IMPRESSIONS  1. Left ventricular ejection fraction, by estimation, is 65 to 70%. The left ventricle has hyperdynamic function. The left ventricle has no regional wall motion abnormalities. There is mild asymmetric left ventricular hypertrophy of the basal-septal segment. Left ventricular diastolic parameters were normal.  2. Right ventricular systolic function is normal. The right ventricular size is normal.  3. The mitral valve is normal in structure. Mild mitral valve regurgitation. No evidence of mitral  stenosis.  4. Small mobile echodensity seen on the noncoronary cusp of the aortic valve, best seen in the parasternal long axis view. The aortic valve is normal in structure. Aortic valve regurgitation is mild. No aortic stenosis is present.  5. Aortic dilatation noted. 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  6. The inferior vena cava is normal in size with greater than 50% respiratory variability, suggesting right atrial pressure of 3 mmHg. Conclusion(s)/Recommendation(s): Cannot exclude small aortic valve vegetation. Consider TEE if clinically indicated. FINDINGS  Left Ventricle: Left ventricular ejection fraction, by estimation, is 65 to 70%. The left ventricle has hyperdynamic function. The left ventricle has no regional wall motion abnormalities. The left ventricular internal cavity size was normal in size. There is mild asymmetric left ventricular hypertrophy of the basal-septal segment. Left ventricular diastolic parameters were normal. Right Ventricle: The right ventricular size is normal. No increase in right ventricular wall thickness. Right ventricular systolic function is normal. Left Atrium: Left atrial size was normal in size. Right Atrium: Right atrial size was normal in size. Pericardium: There is no evidence of pericardial effusion. Mitral Valve: The mitral valve is normal in structure. Mild mitral valve regurgitation. No evidence of mitral valve stenosis. There is no evidence of mitral valve vegetation. Tricuspid Valve: The tricuspid valve is normal in structure. Tricuspid valve regurgitation is not demonstrated. No evidence of tricuspid stenosis. Aortic Valve: Small mobile echodensity seen on the noncoronary cusp of  the aortic valve, best seen in the parasternal long axis view. The aortic valve is normal in structure. Aortic valve regurgitation is mild. Aortic regurgitation PHT measures 562 msec.  No  aortic stenosis is present. Pulmonic Valve: The pulmonic valve was normal in structure. Pulmonic valve regurgitation is not visualized. No evidence of pulmonic stenosis. Aorta: Aortic dilatation noted. 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 Venous: The inferior vena cava is normal in size with greater than 50% respiratory variability, suggesting right atrial pressure of 3 mmHg. IAS/Shunts: No atrial level shunt detected by color flow Doppler.  LEFT VENTRICLE PLAX 2D LVIDd:         4.60 cm     Diastology LVIDs:         2.70 cm     LV e' medial:    6.85 cm/s LV PW:         1.10 cm     LV E/e' medial:  12.6 LV IVS:        1.40 cm     LV e' lateral:   8.49 cm/s LVOT diam:     2.40 cm     LV E/e' lateral: 10.2 LV SV:         127 LV SV Index:   58 LVOT Area:     4.52 cm  LV Volumes (MOD) LV vol d, MOD A2C: 68.0 ml LV vol d, MOD A4C: 80.0 ml LV vol s, MOD A2C: 20.8 ml LV vol s, MOD A4C: 23.1 ml LV SV MOD A2C:     47.2 ml LV SV MOD A4C:     80.0 ml LV SV MOD BP:      51.4 ml RIGHT VENTRICLE RV Basal diam:  3.20 cm RV S prime:     16.30 cm/s TAPSE (M-mode): 3.0 cm LEFT ATRIUM           Index        RIGHT ATRIUM           Index LA diam:      3.80 cm 1.73 cm/m   RA Area:     10.30 cm LA Vol (A2C): 65.6 ml 29.92 ml/m  RA Volume:   17.20 ml  7.84 ml/m LA Vol (A4C): 43.0 ml 19.61 ml/m  AORTIC VALVE LVOT Vmax:   144.00 cm/s LVOT Vmean:  101.000 cm/s LVOT VTI:    0.280 m AI PHT:      562 msec  AORTA Ao Root diam: 4.10 cm Ao Asc diam:  4.35 cm MITRAL VALVE MV Area (PHT): 3.60 cm    SHUNTS MV Decel Time: 211 msec    Systemic VTI:  0.28 m MV E velocity: 86.60 cm/s  Systemic Diam: 2.40 cm MV Ishitha Roper velocity: 60.90 cm/s MV E/Arraya Buck ratio:  1.42 Morene Brownie Electronically signed by Morene Brownie Signature Date/Time: 01/07/2024/4:05:19 PM    Final    DG Chest 2 View Result Date: 01/07/2024 CLINICAL DATA:  Bacteremia, fever EXAM: CHEST - 2  VIEW COMPARISON:  None Available. FINDINGS: The heart size and mediastinal contours are within normal limits. Both lungs are clear. No visible pleural effusions or pneumothorax. No acute osseous abnormality. IMPRESSION: No active cardiopulmonary disease. Electronically Signed   By: Gilmore GORMAN Molt M.D.   On: 01/07/2024 12:26        Scheduled Meds:  enoxaparin  (LOVENOX ) injection  40 mg Subcutaneous Q24H   metoprolol  succinate  50 mg  Oral Daily   Continuous Infusions:  vancomycin  1,250 mg (01/07/24 2302)     LOS: 1 day    Time spent: over 30 min    Meliton Monte, MD [facility]   To contact the attending provider between 7A-7P or the covering provider during after hours 7P-7A, please log into the web site [email or web address] and access using universal [place] password for that web site. If you do not have the password, please call the hospital operator.  01/08/2024, 11:26 AM

## 2024-01-08 NOTE — Progress Notes (Signed)
 PHARMACY - PHYSICIAN COMMUNICATION CRITICAL VALUE ALERT - BLOOD CULTURE IDENTIFICATION (BCID)  Robert Madden is an 63 y.o. male who presented to [facility] on 01/07/2024 with a chief complaint of fever, bacteremia, likely endocarditis, possible CNS infection.   Assessment:  BCID: Enterococcus faecalis, no vancomycin  resistance detected.  Note: Ampicillin  allergy (rash)  Name of physician (or Provider) Contacted: Dr. Perri, Dr. Efrain  Current antibiotics: Vancomycin   Changes to prescribed antibiotics recommended:  Current antibiotics are likely to cover the isolated organism.  Follow up ID consult plans.    Results for orders placed or performed during the hospital encounter of 01/07/24  Blood Culture ID Panel (Reflexed) (Collected: 01/07/2024 10:51 AM)  Result Value Ref Range   Enterococcus faecalis DETECTED (A) NOT DETECTED   Enterococcus Faecium NOT DETECTED NOT DETECTED   Listeria monocytogenes NOT DETECTED NOT DETECTED   Staphylococcus species NOT DETECTED NOT DETECTED   Staphylococcus aureus (BCID) NOT DETECTED NOT DETECTED   Staphylococcus epidermidis NOT DETECTED NOT DETECTED   Staphylococcus lugdunensis NOT DETECTED NOT DETECTED   Streptococcus species NOT DETECTED NOT DETECTED   Streptococcus agalactiae NOT DETECTED NOT DETECTED   Streptococcus pneumoniae NOT DETECTED NOT DETECTED   Streptococcus pyogenes NOT DETECTED NOT DETECTED   A.calcoaceticus-baumannii NOT DETECTED NOT DETECTED   Bacteroides fragilis NOT DETECTED NOT DETECTED   Enterobacterales NOT DETECTED NOT DETECTED   Enterobacter cloacae complex NOT DETECTED NOT DETECTED   Escherichia coli NOT DETECTED NOT DETECTED   Klebsiella aerogenes NOT DETECTED NOT DETECTED   Klebsiella oxytoca NOT DETECTED NOT DETECTED   Klebsiella pneumoniae NOT DETECTED NOT DETECTED   Proteus species NOT DETECTED NOT DETECTED   Salmonella species NOT DETECTED NOT DETECTED   Serratia marcescens NOT DETECTED NOT DETECTED    Haemophilus influenzae NOT DETECTED NOT DETECTED   Neisseria meningitidis NOT DETECTED NOT DETECTED   Pseudomonas aeruginosa NOT DETECTED NOT DETECTED   Stenotrophomonas maltophilia NOT DETECTED NOT DETECTED   Candida albicans NOT DETECTED NOT DETECTED   Candida auris NOT DETECTED NOT DETECTED   Candida glabrata NOT DETECTED NOT DETECTED   Candida krusei NOT DETECTED NOT DETECTED   Candida parapsilosis NOT DETECTED NOT DETECTED   Candida tropicalis NOT DETECTED NOT DETECTED   Cryptococcus neoformans/gattii NOT DETECTED NOT DETECTED   Vancomycin  resistance NOT DETECTED NOT DETECTED    Wanda Hasting PharmD, BCPS [facility] main pharmacy [phone] 01/08/2024 7:28 AM

## 2024-01-08 NOTE — Plan of Care (Signed)
 Contacted by Dr. Perri to provide a second opinion on the patient's MRI. The faint punctate signal seen in the brain parenchyma within the dorsal left frontal lobe on MRI does not have a corresponding hypointense ADC correlate and therefore, by criteria published in the literature, is not classifiable as a definite acute infarct. There is also no T2-weighted correlate. The finding described on the Radiology report is felt most likely to represent artifact.   Electronically signed: Dr. Breona Cherubin

## 2024-01-08 NOTE — Progress Notes (Signed)
 [facility]   Reason for visit: Follow up on aortic valve endocarditis  Interval History: TTE noted and possible vegetation on the aortic valve, WBC 7.1,  Blood cultures done inpatient with Enteroccocus faecalis on BCID with both sets positive and outpatient blood cultures both sets positive with GPC.    Physical Exam: Constitutional:  Vitals:   01/08/24 1137 01/08/24 1316  BP:  110/65  Pulse:  72  Resp:  17  Temp: 99.7 F (37.6 C) 99.7 F (37.6 C)  SpO2:  98%   patient appears in NAD Respiratory: Normal respiratory effort  Review of Systems: Constitutional: negative for malaise and anorexia  Lab Results  Component Value Date   WBC 7.1 01/08/2024   HGB 12.3 (L) 01/08/2024   HCT 36.9 (L) 01/08/2024   MCV 88.5 01/08/2024   PLT 258 01/08/2024    Lab Results  Component Value Date   CREATININE 1.03 01/08/2024   BUN 17 01/08/2024   NA 134 (L) 01/08/2024   K 4.0 01/08/2024   CL 104 01/08/2024   CO2 21 (L) 01/08/2024    Lab Results  Component Value Date   ALT 58 (H) 01/08/2024   AST 29 01/08/2024   ALKPHOS 64 01/08/2024     Microbiology: Recent Results (from the past 240 hours)  Culture, blood (single) w Reflex to ID Panel     Status: Abnormal (Preliminary result)   Collection Time: 01/06/24 10:43 AM   Specimen: Blood  Result Value Ref Range Status   MICRO NUMBER: [id number]  Preliminary   SPECIMEN QUALITY: Suboptimal  Preliminary   Source BLOOD 1  Preliminary   STATUS: PRELIMINARY  Preliminary   Result:   Preliminary    Pos 23.2 Inspection of blood culture bottles indicates that an inadequate volume of blood may have been collected for the detection of sepsis.   ISOLATE 2: Gram positive cocci isolated (AA)  Preliminary    Comment: Gram positive cocci isolated , from aerobic and anaerobic bottles   COMMENT: Aerobic and anaerobic bottle received.  Preliminary  Culture, blood (single) w Reflex to ID Panel     Status: Abnormal  (Preliminary result)   Collection Time: 01/06/24 10:46 AM   Specimen: Blood  Result Value Ref Range Status   MICRO NUMBER: [id number]  Preliminary   SPECIMEN QUALITY: Adequate  Preliminary   Source BLOOD 2  Preliminary   STATUS: PRELIMINARY  Preliminary   ISOLATE 1: Gram positive cocci isolated (AA)  Preliminary    Comment: Gram positive cocci isolated , from aerobic and anaerobic bottles   COMMENT: Aerobic and anaerobic bottle received.  Preliminary  Blood culture (routine x 2)     Status: None (Preliminary result)   Collection Time: 01/07/24 10:51 AM   Specimen: BLOOD  Result Value Ref Range Status   Specimen Description   Final    BLOOD LEFT ANTECUBITAL Performed at [facility], [address] [place], [place] [address]    Special Requests   Final    BOTTLES DRAWN AEROBIC AND ANAEROBIC Blood Culture adequate volume Performed at [facility], [address], [place], [place] [address]    Culture  Setup Time   Final    GRAM POSITIVE COCCI IN BOTH AEROBIC AND ANAEROBIC BOTTLES CRITICAL RESULT CALLED TO, READ BACK BY AND VERIFIED WITH: PHARMD C SHADE 2/0/2025 @ 0725 BY AB Performed at [facility], [address], [place], [place] [address]    Culture VONNE  POSITIVE COCCI  Final   Report Status PENDING  Incomplete  Blood Culture ID Panel (Reflexed)     Status: Abnormal   Collection Time: 01/07/24 10:51 AM  Result Value Ref Range Status   Enterococcus faecalis DETECTED (A) NOT DETECTED Final    Comment: CRITICAL RESULT CALLED TO, READ BACK BY AND VERIFIED WITH: PHARMD C SHADE 2/0/2025 @ 0725 BY AB    Enterococcus Faecium NOT DETECTED NOT DETECTED Final   Listeria monocytogenes NOT DETECTED NOT DETECTED Final   Staphylococcus species NOT DETECTED NOT DETECTED Final   Staphylococcus aureus (BCID) NOT DETECTED NOT DETECTED Final   Staphylococcus epidermidis NOT DETECTED NOT DETECTED Final   Staphylococcus lugdunensis NOT DETECTED NOT  DETECTED Final   Streptococcus species NOT DETECTED NOT DETECTED Final   Streptococcus agalactiae NOT DETECTED NOT DETECTED Final   Streptococcus pneumoniae NOT DETECTED NOT DETECTED Final   Streptococcus pyogenes NOT DETECTED NOT DETECTED Final   A.calcoaceticus-baumannii NOT DETECTED NOT DETECTED Final   Bacteroides fragilis NOT DETECTED NOT DETECTED Final   Enterobacterales NOT DETECTED NOT DETECTED Final   Enterobacter cloacae complex NOT DETECTED NOT DETECTED Final   Escherichia coli NOT DETECTED NOT DETECTED Final   Klebsiella aerogenes NOT DETECTED NOT DETECTED Final   Klebsiella oxytoca NOT DETECTED NOT DETECTED Final   Klebsiella pneumoniae NOT DETECTED NOT DETECTED Final   Proteus species NOT DETECTED NOT DETECTED Final   Salmonella species NOT DETECTED NOT DETECTED Final   Serratia marcescens NOT DETECTED NOT DETECTED Final   Haemophilus influenzae NOT DETECTED NOT DETECTED Final   Neisseria meningitidis NOT DETECTED NOT DETECTED Final   Pseudomonas aeruginosa NOT DETECTED NOT DETECTED Final   Stenotrophomonas maltophilia NOT DETECTED NOT DETECTED Final   Candida albicans NOT DETECTED NOT DETECTED Final   Candida auris NOT DETECTED NOT DETECTED Final   Candida glabrata NOT DETECTED NOT DETECTED Final   Candida krusei NOT DETECTED NOT DETECTED Final   Candida parapsilosis NOT DETECTED NOT DETECTED Final   Candida tropicalis NOT DETECTED NOT DETECTED Final   Cryptococcus neoformans/gattii NOT DETECTED NOT DETECTED Final   Vancomycin  resistance NOT DETECTED NOT DETECTED Final    Comment: Performed at [facility], [address], [place], [place] [address]  Blood culture (routine x 2)     Status: None (Preliminary result)   Collection Time: 01/07/24 11:25 AM   Specimen: BLOOD  Result Value Ref Range Status   Specimen Description   Final    BLOOD LEFT ANTECUBITAL Performed at [facility], [address], [place], [place] [address]    Special  Requests   Final    BOTTLES DRAWN AEROBIC AND ANAEROBIC Blood Culture adequate volume Performed at [facility], [address], [place], [place] [address]    Culture  Setup Time   Final    GRAM POSITIVE COCCI IN BOTH AEROBIC AND ANAEROBIC BOTTLES CRITICAL VALUE NOTED.  VALUE IS CONSISTENT WITH PREVIOUSLY REPORTED AND CALLED VALUE. Performed at [facility], [address], [place], [place] [address]    Culture [facility] POSITIVE COCCI  Final   Report Status PENDING  Incomplete    Impression/Plan:  1. 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  He is on vancomycin  and will continue Will need a TEE Repeat blood cultures sent  2.  Rash - small papular rash on his back.  Not typical appearance of a  drug rash.  Has not spread.  Will continue with vancomycin  for now.  Can consider daptomycin  if it worsens as an embolic process does not seem likely with MRI findings.    3.  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  Not c/w emboli from IE as above.       Evaluation of this patient requires complex antimicrobial therapy evaluation and counseling + isolation needs for disease transmission risk assessment and mitigation.

## 2024-01-08 NOTE — Progress Notes (Signed)
 0035 Pt C/O itching on back. Assessed back. Spotty rash covering entire back, blisters. 4098 Notified hospitalist. Orders received. Will continue to monitor. 0149 Banadryl 25mg  OTO PO given for rash. Will continue to monitor

## 2024-01-09 DIAGNOSIS — R7881 Bacteremia: Secondary | ICD-10-CM | POA: Diagnosis not present

## 2024-01-09 LAB — COMPREHENSIVE METABOLIC PANEL
ALT: 50 U/L — ABNORMAL HIGH (ref 0–44)
AST: 25 U/L (ref 15–41)
Albumin: 3.2 g/dL — ABNORMAL LOW (ref 3.5–5.0)
Alkaline Phosphatase: 60 U/L (ref 38–126)
Anion gap: 9 (ref 5–15)
BUN: 16 mg/dL (ref 6–20)
CO2: 25 mmol/L (ref 22–32)
Calcium: 8.7 mg/dL — ABNORMAL LOW (ref 8.9–10.3)
Chloride: 103 mmol/L (ref 98–111)
Creatinine, Ser: 1.02 mg/dL (ref 0.61–1.24)
GFR, Estimated: >60 mL/min (ref >60)
Glucose, Bld: 107 mg/dL — ABNORMAL HIGH (ref 70–99)
Potassium: 4.3 mmol/L (ref 3.5–5.1)
Sodium: 137 mmol/L (ref 135–145)
Total Bilirubin: 0.7 mg/dL (ref 0.0–1.2)
Total Protein: 7 g/dL (ref 6.5–8.1)

## 2024-01-09 LAB — CBC WITH DIFFERENTIAL/PLATELET
Abs Immature Granulocytes: 0.03 10*3/uL (ref 0.00–0.07)
Basophils Absolute: 0 10*3/uL (ref 0.0–0.1)
Basophils Relative: 1 %
Eosinophils Absolute: 0.1 10*3/uL (ref 0.0–0.5)
Eosinophils Relative: 1 %
HCT: 36.5 % — ABNORMAL LOW (ref 39.0–52.0)
Hemoglobin: 11.9 g/dL — ABNORMAL LOW (ref 13.0–17.0)
Immature Granulocytes: 1 %
Lymphocytes Relative: 28 %
Lymphs Abs: 1.7 10*3/uL (ref 0.7–4.0)
MCH: 28.9 pg (ref 26.0–34.0)
MCHC: 32.6 g/dL (ref 30.0–36.0)
MCV: 88.6 fL (ref 80.0–100.0)
Monocytes Absolute: 0.6 10*3/uL (ref 0.1–1.0)
Monocytes Relative: 10 %
Neutro Abs: 3.6 10*3/uL (ref 1.7–7.7)
Neutrophils Relative %: 59 %
Platelets: 236 10*3/uL (ref 150–400)
RBC: 4.12 MIL/uL — ABNORMAL LOW (ref 4.22–5.81)
RDW: 12.8 % (ref 11.5–15.5)
WBC: 6 10*3/uL (ref 4.0–10.5)
nRBC: 0 % (ref 0.0–0.2)

## 2024-01-09 LAB — ANA, IFA COMPREHENSIVE PANEL
SM/RNP: <1 AI
SSA (Ro) (ENA) Antibody, IgG: <1 AI
SSB (La) (ENA) Antibody, IgG: <1 AI
SSB (La) (ENA) Antibody, IgG: <1 AI
Scleroderma (Scl-70) (ENA) Antibody, IgG: <1 AI
ds DNA Ab: <1 [IU]/mL
ds DNA Ab: NEGATIVE [IU]/mL

## 2024-01-09 LAB — CULTURE, BLOOD (SINGLE)
MICRO NUMBER:: 16052080
SPECIMEN QUALITY:: ADEQUATE

## 2024-01-09 LAB — HIV ANTIBODY (ROUTINE TESTING W REFLEX): HIV 1&2 Ab, 4th Generation: NONREACTIVE

## 2024-01-09 LAB — LIPID PANEL
Cholesterol: 237 mg/dL — ABNORMAL HIGH (ref <200)
Cholesterol: 194 mg/dL (ref 0–200)
HDL: 40 mg/dL (ref >=40)
HDL: 32 mg/dL — ABNORMAL LOW (ref >40)
LDL Cholesterol (Calc): 173 mg/dL — ABNORMAL HIGH
LDL Cholesterol: 144 mg/dL — ABNORMAL HIGH (ref 0–99)
Non-HDL Cholesterol (Calc): 197 mg/dL — ABNORMAL HIGH (ref <130)
Total CHOL/HDL Ratio: 5.9 (calc) — ABNORMAL HIGH (ref <5.0)
Total CHOL/HDL Ratio: 6.1 {ratio}
Triglycerides: 112 mg/dL (ref <150)
Triglycerides: 88 mg/dL (ref <150)
VLDL: 18 mg/dL (ref 0–40)

## 2024-01-09 LAB — MAGNESIUM: Magnesium: 2.4 mg/dL (ref 1.7–2.4)

## 2024-01-09 LAB — C-REACTIVE PROTEIN: CRP: 63 mg/L — ABNORMAL HIGH (ref <8.0)

## 2024-01-09 LAB — HEPATITIS PANEL, ACUTE
Hep A IgM: NONREACTIVE
Hepatitis B Surface Ag: NONREACTIVE
Hepatitis C Ab: NONREACTIVE
Hepatitis C Ab: NONREACTIVE

## 2024-01-09 LAB — PHOSPHORUS: Phosphorus: 4.1 mg/dL (ref 2.5–4.6)

## 2024-01-09 LAB — CYCLIC CITRUL PEPTIDE ANTIBODY, IGG: Cyclic Citrullin Peptide Ab: <16 U

## 2024-01-09 LAB — RPR: RPR Ser Ql: NONREACTIVE

## 2024-01-09 LAB — SEDIMENTATION RATE: Sed Rate: 18 mm/h (ref 0–20)

## 2024-01-09 LAB — HEMOGLOBIN A1C
Hgb A1c MFr Bld: 5.4 % (ref 4.8–5.6)
Mean Plasma Glucose: 108.28 mg/dL

## 2024-01-09 LAB — HEPATITIS B SURFACE ANTIBODY, QUANTITATIVE: Hep B S AB Quant (Post): <3.5 m[IU]/mL — ABNORMAL LOW

## 2024-01-09 LAB — RHEUMATOID FACTOR: Rheumatoid fact SerPl-aCnc: 13 [IU]/mL (ref <14)

## 2024-01-09 NOTE — Plan of Care (Signed)

## 2024-01-09 NOTE — Plan of Care (Signed)
   Problem: Education: Goal: Knowledge of General Education information will improve Description: Including pain rating scale, medication(s)/side effects and non-pharmacologic comfort measures Outcome: Progressing   Problem: Health Behavior/Discharge Planning: Goal: Ability to manage health-related needs will improve Outcome: Progressing   Problem: Activity: Goal: Risk for activity intolerance will decrease Outcome: Progressing   Problem: Elimination: Goal: Will not experience complications related to bowel motility Outcome: Progressing

## 2024-01-09 NOTE — Progress Notes (Signed)
 PROGRESS NOTE    Robert Madden  [name] DOB: 10-23-1961 DOA: 01/07/2024 PCP: [name], [name], MD (Inactive)  Chief Complaint  Patient presents with   Fever    Brief Narrative:    Robert Madden is Robert Madden 63 y.o. male with medical history significant of aortic insufficiency, aortic root dilation, thoracic aortic aneurysm, hyperlipidemia, IBS, paroxysmal tachycardia who has been having fever associated with malaise for the past 3 weeks and has been referred by ID to the emergency department for admission due to blood cultures growing gram-positive bacteria.   Assessment & Plan:   Principal Problem:   Bacteremia Active Problems:   Aortic aneurysm, thoracic (HCC)   SVT (supraventricular tachycardia) (HCC)   Hypercholesterolemia without hypertriglyceridemia   Abnormal LFTs   Hyponatremia   Hypocarbia  Enterococcus Faecalis Bacteremia Concern for Endocarditis Blood culture 2/7 with enterococcus faecalis on BCID -> cultures pending Repeat 2/8 cultures NGx1  Echo with small mobile echodensity on noncoronary cusp of aortic valve - can't excluded small aortic valve vegetation Planning for TEE, sounds like Monday 2/10 MRI brain without evidence for brain abscess or CNS infection Appreciate ID assistance  Incidental Stroke  MRI brain with single punctate focus of diffusion signal abnormality involving the high left frontal lobe, concerning for tiny acute to subacute ischemic infarct Will discuss with neurology, doesn't sounds like he's had clear neuro symptoms (maybe double vision Robert Madden week or so ago)  Elevated LFT's  Mild RUQ US  without cholelithiasis or cholecystitis  Negative hep Robert Madden Ab Hb B surface Ab pending  Aortic Aneurysm Outpatient follow up  SVT Metoprolol     DVT prophylaxis: lovenox  Code Status: full Family Communication: wife at bedside Disposition:   Status is: Inpatient Remains inpatient appropriate because: need for continued inpatient care    Consultants:  ID  Procedures:  Echo IMPRESSIONS     1. Left ventricular ejection fraction, by estimation, is 65 to 70%. The  left ventricle has hyperdynamic function. The left ventricle has no  regional wall motion abnormalities. There is mild asymmetric left  ventricular hypertrophy of the basal-septal  segment. Left ventricular diastolic parameters were normal.   2. Right ventricular systolic function is normal. The right ventricular  size is normal.   3. The mitral valve is normal in structure. Mild mitral valve  regurgitation. No evidence of mitral stenosis.   4. Small mobile echodensity seen on the noncoronary cusp of the aortic  valve, best seen in the parasternal long axis view. The aortic valve is  normal in structure. Aortic valve regurgitation is mild. No aortic  stenosis is present.   5. Aortic dilatation noted. There is moderate dilatation of the aortic  root, measuring 43 mm. There is moderate dilatation of the ascending  aorta, measuring 44 mm.   6. The inferior vena cava is normal in size with greater than 50%  respiratory variability, suggesting right atrial pressure of 3 mmHg.   Conclusion(s)/Recommendation(s): Cannot exclude small aortic valve  vegetation. Consider TEE if clinically indicated.   Antimicrobials:  Anti-infectives (From admission, onward)    Start     Dose/Rate Route Frequency Ordered Stop   01/08/24 0100  vancomycin  (VANCOCIN ) IVPB 1000 mg/200 mL premix  Status:  Discontinued        1,000 mg 200 mL/hr over 60 Minutes Intravenous Every 12 hours 01/07/24 1159 01/07/24 1515   01/07/24 2300  DAPTOmycin  (CUBICIN ) 800 mg in sodium chloride  0.9 % IVPB  Status:  Discontinued        8  mg/kg  97.1 kg 132 mL/hr over 30 Minutes Intravenous Daily 01/07/24 1515 01/07/24 1522   01/07/24 2300  DAPTOmycin  (CUBICIN ) IVPB 700 mg/142mL premix  Status:  Discontinued        8 mg/kg  85.4 kg (Adjusted) 200 mL/hr over 30 Minutes Intravenous Daily 01/07/24 1522  01/07/24 1525   01/07/24 2300  vancomycin  (VANCOREADY) IVPB 1250 mg/250 mL        1,250 mg 166.7 mL/hr over 90 Minutes Intravenous Every 12 hours 01/07/24 1528     01/07/24 1145  vancomycin  (VANCOREADY) IVPB 2000 mg/400 mL        2,000 mg 200 mL/hr over 120 Minutes Intravenous  Once 01/07/24 1134 01/07/24 1409       Subjective: Headache improved  Objective: Vitals:   01/08/24 1316 01/08/24 2032 01/09/24 0456 01/09/24 0951  BP: 110/65 117/73 107/63 107/63  Pulse: 72 82 79 79  Resp: 17 20 18    Temp: 99.7 F (37.6 C) 99.7 F (37.6 C) 99 F (37.2 C)   TempSrc: Oral Oral Oral   SpO2: 98% 97% 98%   Weight:      Height:       No intake or output data in the 24 hours ending 01/09/24 1133  Filed Weights   01/07/24 1019  Weight: 97.1 kg    Examination:  General: No acute distress. Cardiovascular: Heart sounds show Robert Madden regular rate, and rhythm. No gallops or rubs. No murmurs. Lungs: unlabored Neurological: Alert and oriented 3. Moves all extremities 4. Cranial nerves II through XII grossly intact. Skin: stable diffuse maculopapular rash to back  Extremities: No clubbing or cyanosis. No edema   Data Reviewed: I have personally reviewed following labs and imaging studies  CBC: Recent Labs  Lab 01/07/24 1053 01/08/24 0425 01/09/24 0412  WBC 7.8 7.1 6.0  NEUTROABS 5.5  --  3.6  HGB 13.5 12.3* 11.9*  HCT 40.2 36.9* 36.5*  MCV 86.8 88.5 88.6  PLT 298 258 236    Basic Metabolic Panel: Recent Labs  Lab 01/07/24 1053 01/08/24 0425 01/09/24 0412  NA 133* 134* 137  K 4.2 4.0 4.3  CL 101 104 103  CO2 21* 21* 25  GLUCOSE 96 116* 107*  BUN 16 17 16   CREATININE 1.17 1.03 1.02  CALCIUM 9.0 8.7* 8.7*  MG  --   --  2.4  PHOS  --   --  4.1    GFR: Estimated Creatinine Clearance: 93 mL/min (by C-G formula based on SCr of 1.02 mg/dL).  Liver Function Tests: Recent Labs  Lab 01/07/24 1053 01/07/24 2136 01/08/24 0425 01/09/24 0412  AST 33 33 29 25  ALT 63* 61*  58* 50*  ALKPHOS 70 68 64 60  BILITOT 1.2 0.8 0.9 0.7  PROT 7.7 7.5 7.4 7.0  ALBUMIN 3.6 3.4* 3.5 3.2*    CBG: No results for input(s): GLUCAP in the last 168 hours.   Recent Results (from the past 240 hours)  Culture, blood (single) w Reflex to ID Panel     Status: Abnormal (Preliminary result)   Collection Time: 01/06/24 10:43 AM   Specimen: Blood  Result Value Ref Range Status   MICRO NUMBER: [id number]  Preliminary   SPECIMEN QUALITY: Suboptimal  Preliminary   Source BLOOD 1  Preliminary   STATUS: PRELIMINARY  Preliminary   Result:   Preliminary    Pos 23.2 Inspection of blood culture bottles indicates that an inadequate volume of blood may have been collected for the detection of sepsis.  ISOLATE 2: Gram positive cocci isolated (AA)  Preliminary    Comment: Gram positive cocci isolated , from aerobic and anaerobic bottles   COMMENT: Aerobic and anaerobic bottle received.  Preliminary  Culture, blood (single) w Reflex to ID Panel     Status: Abnormal (Preliminary result)   Collection Time: 01/06/24 10:46 AM   Specimen: Blood  Result Value Ref Range Status   MICRO NUMBER: [id number]  Preliminary   SPECIMEN QUALITY: Adequate  Preliminary   Source BLOOD 2  Preliminary   STATUS: PRELIMINARY  Preliminary   ISOLATE 1: Gram positive cocci isolated (AA)  Preliminary    Comment: Gram positive cocci isolated , from aerobic and anaerobic bottles   COMMENT: Aerobic and anaerobic bottle received.  Preliminary  Blood culture (routine x 2)     Status: None (Preliminary result)   Collection Time: 01/07/24 10:51 AM   Specimen: BLOOD  Result Value Ref Range Status   Specimen Description   Final    BLOOD LEFT ANTECUBITAL Performed at [facility], [address], [place], [place] [address]    Special Requests   Final    BOTTLES DRAWN AEROBIC AND ANAEROBIC Blood Culture adequate volume Performed at [facility], [address], [place], [place]  [address]    Culture  Setup Time   Final    GRAM POSITIVE COCCI IN BOTH AEROBIC AND ANAEROBIC BOTTLES CRITICAL RESULT CALLED TO, READ BACK BY AND VERIFIED WITH: PHARMD C SHADE 2/0/2025 @ 0725 BY AB Performed at [facility], [address] [place], [place] [address]    Culture GRAM POSITIVE COCCI  Final   Report Status PENDING  Incomplete  Blood Culture ID Panel (Reflexed)     Status: Abnormal   Collection Time: 01/07/24 10:51 AM  Result Value Ref Range Status   Enterococcus faecalis DETECTED (Robert Madden) NOT DETECTED Final    Comment: CRITICAL RESULT CALLED TO, READ BACK BY AND VERIFIED WITH: PHARMD C SHADE 2/0/2025 @ 0725 BY AB    Enterococcus Faecium NOT DETECTED NOT DETECTED Final   Listeria monocytogenes NOT DETECTED NOT DETECTED Final   Staphylococcus species NOT DETECTED NOT DETECTED Final   Staphylococcus aureus (BCID) NOT DETECTED NOT DETECTED Final   Staphylococcus epidermidis NOT DETECTED NOT DETECTED Final   Staphylococcus lugdunensis NOT DETECTED NOT DETECTED Final   Streptococcus species NOT DETECTED NOT DETECTED Final   Streptococcus agalactiae NOT DETECTED NOT DETECTED Final   Streptococcus pneumoniae NOT DETECTED NOT DETECTED Final   Streptococcus pyogenes NOT DETECTED NOT DETECTED Final   Saja Bartolini.calcoaceticus-baumannii NOT DETECTED NOT DETECTED Final   Bacteroides fragilis NOT DETECTED NOT DETECTED Final   Enterobacterales NOT DETECTED NOT DETECTED Final   Enterobacter cloacae complex NOT DETECTED NOT DETECTED Final   Escherichia coli NOT DETECTED NOT DETECTED Final   Klebsiella aerogenes NOT DETECTED NOT DETECTED Final   Klebsiella oxytoca NOT DETECTED NOT DETECTED Final   Klebsiella pneumoniae NOT DETECTED NOT DETECTED Final   Proteus species NOT DETECTED NOT DETECTED Final   Salmonella species NOT DETECTED NOT DETECTED Final   Serratia marcescens NOT DETECTED NOT DETECTED Final   Haemophilus influenzae NOT DETECTED NOT DETECTED Final   Neisseria meningitidis NOT  DETECTED NOT DETECTED Final   Pseudomonas aeruginosa NOT DETECTED NOT DETECTED Final   Stenotrophomonas maltophilia NOT DETECTED NOT DETECTED Final   Candida albicans NOT DETECTED NOT DETECTED Final   Candida auris NOT DETECTED NOT DETECTED Final   Candida glabrata NOT DETECTED NOT DETECTED Final  Candida krusei NOT DETECTED NOT DETECTED Final   Candida parapsilosis NOT DETECTED NOT DETECTED Final   Candida tropicalis NOT DETECTED NOT DETECTED Final   Cryptococcus neoformans/gattii NOT DETECTED NOT DETECTED Final   Vancomycin  resistance NOT DETECTED NOT DETECTED Final    Comment: Performed at [facility], [address] [place], [place] [address]  Blood culture (routine x 2)     Status: None (Preliminary result)   Collection Time: 01/07/24 11:25 AM   Specimen: BLOOD  Result Value Ref Range Status   Specimen Description   Final    BLOOD LEFT ANTECUBITAL Performed at [facility], [address] [place], [place] [address]    Special Requests   Final    BOTTLES DRAWN AEROBIC AND ANAEROBIC Blood Culture adequate volume Performed at [facility], [address] [place], [place] [address]    Culture  Setup Time   Final    GRAM POSITIVE COCCI IN BOTH AEROBIC AND ANAEROBIC BOTTLES CRITICAL VALUE NOTED.  VALUE IS CONSISTENT WITH PREVIOUSLY REPORTED AND CALLED VALUE. Performed at [facility], [address], [place], [place] [address]    Culture GRAM POSITIVE COCCI  Final   Report Status PENDING  Incomplete  Culture, blood (Routine X 2) w Reflex to ID Panel     Status: None (Preliminary result)   Collection Time: 01/08/24  4:14 AM   Specimen: BLOOD RIGHT ARM  Result Value Ref Range Status   Specimen Description   Final    BLOOD RIGHT ARM Performed at [facility], [address] [place], [place] [address]    Special Requests   Final    BOTTLES DRAWN AEROBIC ONLY Blood Culture results may not be optimal due to an inadequate  volume of blood received in culture bottles Performed at [facility], [address], [place], [place] [address]    Culture   Final    NO GROWTH 1 DAY Performed at [facility], [address], [place], [place] [address]    Report Status PENDING  Incomplete  Culture, blood (Routine X 2) w Reflex to ID Panel     Status: None (Preliminary result)   Collection Time: 01/08/24  4:25 AM   Specimen: BLOOD LEFT HAND  Result Value Ref Range Status   Specimen Description   Final    BLOOD LEFT HAND Performed at [facility], [address] [place], [place] [address]    Special Requests   Final    BOTTLES DRAWN AEROBIC AND ANAEROBIC Blood Culture results may not be optimal due to an inadequate volume of blood received in culture bottles Performed at [facility], [address] [place], [place] [address]    Culture   Final    NO GROWTH 1 DAY Performed at [facility], [address], [place], [place] [address]    Report Status PENDING  Incomplete         Radiology Studies: MR BRAIN W WO CONTRAST Result Date: 01/07/2024 CLINICAL DATA:  Initial evaluation for brain abscess, gram-positive bacteremia, subacute endocarditis, headaches. EXAM: MRI HEAD WITHOUT AND WITH CONTRAST TECHNIQUE: Multiplanar, multiecho pulse sequences of the brain and surrounding structures were obtained without and with intravenous contrast. CONTRAST:  10mL GADAVIST  GADOBUTROL  1 MMOL/ML IV SOLN COMPARISON:  Prior head CT from 05/30/2009. FINDINGS: Brain: Cerebral volume within normal limits. Few scattered subcentimeter foci of T2/FLAIR hyperintensity noted involving the periventricular deep white  matter of both cerebral hemispheres, most pronounced about the frontal lobes, nonspecific, but most commonly related to mild chronic microvascular ischemic disease. Changes are mild in nature. Single punctate focus of diffusion signal abnormality seen involving the high left  frontal lobe (series 5, image 47), suspicious for Robert Madden tiny acute to subacute ischemic infarct. No associated hemorrhage. No other evidence for acute or subacute ischemia. Gray-white matter adjacent otherwise maintained. No acute or chronic intracranial blood products. No mass lesion, midline shift or mass effect. No hydrocephalus or extra-axial fluid collection. Pituitary gland suprasellar region within normal limits. No abnormal enhancement. No evidence for brain abscess or other CNS infection. Vascular: Major intracranial vascular flow voids are maintained. Skull and upper cervical spine: Craniocervical junction within normal limits. Bone marrow signal intensity normal. No scalp soft tissue abnormality. Sinuses/Orbits: Globes orbital soft tissues within normal limits. Mild scattered mucosal thickening noted about the ethmoidal air cells. Paranasal sinuses are otherwise clear. Trace right mastoid effusion noted, of doubtful significance. Other: None. IMPRESSION: 1. Single punctate focus of diffusion signal abnormality involving the high left frontal lobe, suspicious for Robert Madden tiny acute to subacute ischemic infarct. No associated hemorrhage. 2. No other acute intracranial abnormality. No evidence for brain abscess or other CNS infection. 3. Underlying mild cerebral white matter disease, nonspecific, but most commonly related to chronic microvascular ischemic disease. Electronically Signed   By: Robert Madden M.D.   On: 01/07/2024 20:47   US  Abdomen Limited RUQ (LIVER/GB) Result Date: 01/07/2024 CLINICAL DATA:  Transaminitis EXAM: ULTRASOUND ABDOMEN LIMITED RIGHT UPPER QUADRANT COMPARISON:  None Available. FINDINGS: Gallbladder: No gallstones or wall thickening visualized. No sonographic Murphy sign noted by sonographer. Common bile duct: Diameter: 2.5 mm Liver: No focal lesion identified. Within normal limits in parenchymal echogenicity. Portal vein is patent on color Doppler imaging with normal direction of  blood flow towards the liver. Other: None. IMPRESSION: No cholelithiasis or sonographic evidence for acute cholecystitis. Electronically Signed   By: Robert Madden M.D.   On: 01/07/2024 16:29   ECHOCARDIOGRAM COMPLETE Result Date: 01/07/2024    ECHOCARDIOGRAM REPORT   Patient Name:   Robert Madden Date of Exam: 01/07/2024 Medical Rec #:  [id number]         Height:       72.0 in Accession #:    [id number]        Weight:       214.0 lb Date of Birth:  1961-06-13         BSA:          2.193 m Patient Age:    62 years          BP:           122/73 mmHg Patient Gender: M                 HR:           75 bpm. Exam Location:  Inpatient Procedure: 2D Echo, Cardiac Doppler and Color Doppler Indications:    Bacteremia R78.81  History:        Patient has no prior history of Echocardiogram examinations.                 Signs/Symptoms:Bacteremia.  Sonographer:    Lanell Maduro Referring Phys: [id number] Robert Madden IMPRESSIONS  1. Left ventricular ejection fraction, by estimation, is 65 to 70%. The left ventricle has hyperdynamic function. The left ventricle has no regional wall motion abnormalities. There is mild asymmetric left ventricular hypertrophy  of the basal-septal segment. Left ventricular diastolic parameters were normal.  2. Right ventricular systolic function is normal. The right ventricular size is normal.  3. The mitral valve is normal in structure. Mild mitral valve regurgitation. No evidence of mitral stenosis.  4. Small mobile echodensity seen on the noncoronary cusp of the aortic valve, best seen in the parasternal long axis view. The aortic valve is normal in structure. Aortic valve regurgitation is mild. No aortic stenosis is present.  5. Aortic dilatation noted. 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  6. The inferior vena cava is normal in size with greater than 50% respiratory variability, suggesting  right atrial pressure of 3 mmHg. Conclusion(s)/Recommendation(s): Cannot exclude small aortic valve vegetation. Consider TEE if clinically indicated. FINDINGS  Left Ventricle: Left ventricular ejection fraction, by estimation, is 65 to 70%. The left ventricle has hyperdynamic function. The left ventricle has no regional wall motion abnormalities. The left ventricular internal cavity size was normal in size. There is mild asymmetric left ventricular hypertrophy of the basal-septal segment. Left ventricular diastolic parameters were normal. Right Ventricle: The right ventricular size is normal. No increase in right ventricular wall thickness. Right ventricular systolic function is normal. Left Atrium: Left atrial size was normal in size. Right Atrium: Right atrial size was normal in size. Pericardium: There is no evidence of pericardial effusion. Mitral Valve: The mitral valve is normal in structure. Mild mitral valve regurgitation. No evidence of mitral valve stenosis. There is no evidence of mitral valve vegetation. Tricuspid Valve: The tricuspid valve is normal in structure. Tricuspid valve regurgitation is not demonstrated. No evidence of tricuspid stenosis. Aortic Valve: Small mobile echodensity seen on the noncoronary cusp of the aortic valve, best seen in the parasternal long axis view. The aortic valve is normal in structure. Aortic valve regurgitation is mild. Aortic regurgitation PHT measures 562 msec.  No aortic stenosis is present. Pulmonic Valve: The pulmonic valve was normal in structure. Pulmonic valve regurgitation is not visualized. No evidence of pulmonic stenosis. Aorta: Aortic dilatation noted. 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 Venous: The inferior vena cava is normal in size with greater than 50% respiratory variability, suggesting right atrial pressure of 3 mmHg. IAS/Shunts: No atrial level shunt  detected by color flow Doppler.  LEFT VENTRICLE PLAX 2D LVIDd:         4.60 cm     Diastology LVIDs:         2.70 cm     LV e' medial:    6.85 cm/s LV PW:         1.10 cm     LV E/e' medial:  12.6 LV IVS:        1.40 cm     LV e' lateral:   8.49 cm/s LVOT diam:     2.40 cm     LV E/e' lateral: 10.2 LV SV:         127 LV SV Index:   58 LVOT Area:     4.52 cm  LV Volumes (MOD) LV vol d, MOD A2C: 68.0 ml LV vol d, MOD A4C: 80.0 ml LV vol s, MOD A2C: 20.8 ml LV vol s, MOD A4C: 23.1 ml LV SV MOD A2C:     47.2 ml LV SV MOD A4C:     80.0 ml LV  SV MOD BP:      51.4 ml RIGHT VENTRICLE RV Basal diam:  3.20 cm RV S prime:     16.30 cm/s TAPSE (M-mode): 3.0 cm LEFT ATRIUM           Index        RIGHT ATRIUM           Index LA diam:      3.80 cm 1.73 cm/m   RA Area:     10.30 cm LA Vol (A2C): 65.6 ml 29.92 ml/m  RA Volume:   17.20 ml  7.84 ml/m LA Vol (A4C): 43.0 ml 19.61 ml/m  AORTIC VALVE LVOT Vmax:   144.00 cm/s LVOT Vmean:  101.000 cm/s LVOT VTI:    0.280 m AI PHT:      562 msec  AORTA Ao Root diam: 4.10 cm Ao Asc diam:  4.35 cm MITRAL VALVE MV Area (PHT): 3.60 cm    SHUNTS MV Decel Time: 211 msec    Systemic VTI:  0.28 m MV E velocity: 86.60 cm/s  Systemic Diam: 2.40 cm MV Marzell Isakson velocity: 60.90 cm/s MV E/Cordera Stineman ratio:  1.42 Robert Madden Electronically signed by Robert Madden Signature Date/Time: 01/07/2024/4:05:19 PM    Final    DG Chest 2 View Result Date: 01/07/2024 CLINICAL DATA:  Bacteremia, fever EXAM: CHEST - 2 VIEW COMPARISON:  None Available. FINDINGS: The heart size and mediastinal contours are within normal limits. Both lungs are clear. No visible pleural effusions or pneumothorax. No acute osseous abnormality. IMPRESSION: No active cardiopulmonary disease. Electronically Signed   By: Gilmore GORMAN Molt M.D.   On: 01/07/2024 12:26        Scheduled Meds:  enoxaparin  (LOVENOX ) injection  40 mg Subcutaneous Q24H   metoprolol  succinate  50 mg Oral Daily   Continuous Infusions:  vancomycin  1,250 mg  (01/09/24 1124)     LOS: 2 days    Time spent: over 30 min    Robert Monte, MD [facility]   To contact the attending provider between 7A-7P or the covering provider during after hours 7P-7A, please log into the web site [email or web address] and access using universal [place] password for that web site. If you do not have the password, please call the hospital operator.  01/09/2024, 11:33 AM

## 2024-01-09 NOTE — H&P (View-Only) (Signed)
 PROGRESS NOTE    Robert Madden  [name] DOB: 1961-06-22 DOA: 01/07/2024 PCP: [name], [name], MD (Inactive)  Chief Complaint  Patient presents with   Fever    Brief Narrative:    [name] is Robert Madden 63 y.o. male with medical history significant of aortic insufficiency, aortic root dilation, thoracic aortic aneurysm, hyperlipidemia, IBS, paroxysmal tachycardia who has been having fever associated with malaise for the past 3 weeks and has been referred by ID to the emergency department for admission due to blood cultures growing gram-positive bacteria.   Assessment & Plan:   Principal Problem:   Bacteremia Active Problems:   Aortic aneurysm, thoracic (HCC)   SVT (supraventricular tachycardia) (HCC)   Hypercholesterolemia without hypertriglyceridemia   Abnormal LFTs   Hyponatremia   Hypocarbia  Enterococcus Faecalis Bacteremia Concern for Endocarditis Blood culture 2/7 with enterococcus faecalis on BCID -> cultures pending Repeat 2/8 cultures NGx1  Echo with small mobile echodensity on noncoronary cusp of aortic valve - can't excluded small aortic valve vegetation Planning for TEE, sounds like Monday 2/10 MRI brain without evidence for brain abscess or CNS infection Appreciate ID assistance  Incidental Stroke  MRI brain with single punctate focus of diffusion signal abnormality involving the high left frontal lobe, concerning for tiny acute to subacute ischemic infarct Will discuss with neurology, doesn't sounds like he's had clear neuro symptoms (maybe double vision Laparis Durrett week or so ago)  Elevated LFT's  Mild RUQ US  without cholelithiasis or cholecystitis  Negative hep Tray Klayman Ab Hb B surface Ab pending  Aortic Aneurysm Outpatient follow up  SVT Metoprolol     DVT prophylaxis: lovenox  Code Status: full Family Communication: wife at bedside Disposition:   Status is: Inpatient Remains inpatient appropriate because: need for continued inpatient care    Consultants:  ID  Procedures:  Echo IMPRESSIONS     1. Left ventricular ejection fraction, by estimation, is 65 to 70%. The  left ventricle has hyperdynamic function. The left ventricle has no  regional wall motion abnormalities. There is mild asymmetric left  ventricular hypertrophy of the basal-septal  segment. Left ventricular diastolic parameters were normal.   2. Right ventricular systolic function is normal. The right ventricular  size is normal.   3. The mitral valve is normal in structure. Mild mitral valve  regurgitation. No evidence of mitral stenosis.   4. Small mobile echodensity seen on the noncoronary cusp of the aortic  valve, best seen in the parasternal long axis view. The aortic valve is  normal in structure. Aortic valve regurgitation is mild. No aortic  stenosis is present.   5. Aortic dilatation noted. There is moderate dilatation of the aortic  root, measuring 43 mm. There is moderate dilatation of the ascending  aorta, measuring 44 mm.   6. The inferior vena cava is normal in size with greater than 50%  respiratory variability, suggesting right atrial pressure of 3 mmHg.   Conclusion(s)/Recommendation(s): Cannot exclude small aortic valve  vegetation. Consider TEE if clinically indicated.   Antimicrobials:  Anti-infectives (From admission, onward)    Start     Dose/Rate Route Frequency Ordered Stop   01/08/24 0100  vancomycin  (VANCOCIN ) IVPB 1000 mg/200 mL premix  Status:  Discontinued        1,000 mg 200 mL/hr over 60 Minutes Intravenous Every 12 hours 01/07/24 1159 01/07/24 1515   01/07/24 2300  DAPTOmycin  (CUBICIN ) 800 mg in sodium chloride  0.9 % IVPB  Status:  Discontinued        8  mg/kg  97.1 kg 132 mL/hr over 30 Minutes Intravenous Daily 01/07/24 1515 01/07/24 1522   01/07/24 2300  DAPTOmycin  (CUBICIN ) IVPB 700 mg/156mL premix  Status:  Discontinued        8 mg/kg  85.4 kg (Adjusted) 200 mL/hr over 30 Minutes Intravenous Daily 01/07/24 1522  01/07/24 1525   01/07/24 2300  vancomycin  (VANCOREADY) IVPB 1250 mg/250 mL        1,250 mg 166.7 mL/hr over 90 Minutes Intravenous Every 12 hours 01/07/24 1528     01/07/24 1145  vancomycin  (VANCOREADY) IVPB 2000 mg/400 mL        2,000 mg 200 mL/hr over 120 Minutes Intravenous  Once 01/07/24 1134 01/07/24 1409       Subjective: Headache improved  Objective: Vitals:   01/08/24 1316 01/08/24 2032 01/09/24 0456 01/09/24 0951  BP: 110/65 117/73 107/63 107/63  Pulse: 72 82 79 79  Resp: 17 20 18    Temp: 99.7 F (37.6 C) 99.7 F (37.6 C) 99 F (37.2 C)   TempSrc: Oral Oral Oral   SpO2: 98% 97% 98%   Weight:      Height:       No intake or output data in the 24 hours ending 01/09/24 1133  Filed Weights   01/07/24 1019  Weight: 97.1 kg    Examination:  General: No acute distress. Cardiovascular: Heart sounds show Neko [name] regular rate, and rhythm. No gallops or rubs. No murmurs. Lungs: unlabored Neurological: Alert and oriented 3. Moves all extremities 4. Cranial nerves II through XII grossly intact. Skin: stable diffuse maculopapular rash to back  Extremities: No clubbing or cyanosis. No edema   Data Reviewed: I have personally reviewed following labs and imaging studies  CBC: Recent Labs  Lab 01/07/24 1053 01/08/24 0425 01/09/24 0412  WBC 7.8 7.1 6.0  NEUTROABS 5.5  --  3.6  HGB 13.5 12.3* 11.9*  HCT 40.2 36.9* 36.5*  MCV 86.8 88.5 88.6  PLT 298 258 236    Basic Metabolic Panel: Recent Labs  Lab 01/07/24 1053 01/08/24 0425 01/09/24 0412  NA 133* 134* 137  K 4.2 4.0 4.3  CL 101 104 103  CO2 21* 21* 25  GLUCOSE 96 116* 107*  BUN 16 17 16   CREATININE 1.17 1.03 1.02  CALCIUM 9.0 8.7* 8.7*  MG  --   --  2.4  PHOS  --   --  4.1    GFR: Estimated Creatinine Clearance: 93 mL/min (by C-G formula based on SCr of 1.02 mg/dL).  Liver Function Tests: Recent Labs  Lab 01/07/24 1053 01/07/24 2136 01/08/24 0425 01/09/24 0412  AST 33 33 29 25  ALT 63* 61*  58* 50*  ALKPHOS 70 68 64 60  BILITOT 1.2 0.8 0.9 0.7  PROT 7.7 7.5 7.4 7.0  ALBUMIN 3.6 3.4* 3.5 3.2*    CBG: No results for input(s): GLUCAP in the last 168 hours.   Recent Results (from the past 240 hours)  Culture, blood (single) w Reflex to ID Panel     Status: Abnormal (Preliminary result)   Collection Time: 01/06/24 10:43 AM   Specimen: Blood  Result Value Ref Range Status   MICRO NUMBER: [id number]  Preliminary   SPECIMEN QUALITY: Suboptimal  Preliminary   Source BLOOD 1  Preliminary   STATUS: PRELIMINARY  Preliminary   Result:   Preliminary    Pos 23.2 Inspection of blood culture bottles indicates that an inadequate volume of blood may have been collected for the detection of sepsis.  ISOLATE 2: Gram positive cocci isolated (AA)  Preliminary    Comment: Gram positive cocci isolated , from aerobic and anaerobic bottles   COMMENT: Aerobic and anaerobic bottle received.  Preliminary  Culture, blood (single) w Reflex to ID Panel     Status: Abnormal (Preliminary result)   Collection Time: 01/06/24 10:46 AM   Specimen: Blood  Result Value Ref Range Status   MICRO NUMBER: [id number]  Preliminary   SPECIMEN QUALITY: Adequate  Preliminary   Source BLOOD 2  Preliminary   STATUS: PRELIMINARY  Preliminary   ISOLATE 1: Gram positive cocci isolated (AA)  Preliminary    Comment: Gram positive cocci isolated , from aerobic and anaerobic bottles   COMMENT: Aerobic and anaerobic bottle received.  Preliminary  Blood culture (routine x 2)     Status: None (Preliminary result)   Collection Time: 01/07/24 10:51 AM   Specimen: BLOOD  Result Value Ref Range Status   Specimen Description   Final    BLOOD LEFT ANTECUBITAL Performed at [facility], [address], [place], [place] [address]    Special Requests   Final    BOTTLES DRAWN AEROBIC AND ANAEROBIC Blood Culture adequate volume Performed at [facility], [address], [place], [place]  [address]    Culture  Setup Time   Final    GRAM POSITIVE COCCI IN BOTH AEROBIC AND ANAEROBIC BOTTLES CRITICAL RESULT CALLED TO, READ BACK BY AND VERIFIED WITH: PHARMD C SHADE 2/0/2025 @ 0725 BY AB Performed at [facility], [address] [place], [place] [address]    Culture GRAM POSITIVE COCCI  Final   Report Status PENDING  Incomplete  Blood Culture ID Panel (Reflexed)     Status: Abnormal   Collection Time: 01/07/24 10:51 AM  Result Value Ref Range Status   Enterococcus faecalis DETECTED (Abhijot Straughter) NOT DETECTED Final    Comment: CRITICAL RESULT CALLED TO, READ BACK BY AND VERIFIED WITH: PHARMD C SHADE 2/0/2025 @ 0725 BY AB    Enterococcus Faecium NOT DETECTED NOT DETECTED Final   Listeria monocytogenes NOT DETECTED NOT DETECTED Final   Staphylococcus species NOT DETECTED NOT DETECTED Final   Staphylococcus aureus (BCID) NOT DETECTED NOT DETECTED Final   Staphylococcus epidermidis NOT DETECTED NOT DETECTED Final   Staphylococcus lugdunensis NOT DETECTED NOT DETECTED Final   Streptococcus species NOT DETECTED NOT DETECTED Final   Streptococcus agalactiae NOT DETECTED NOT DETECTED Final   Streptococcus pneumoniae NOT DETECTED NOT DETECTED Final   Streptococcus pyogenes NOT DETECTED NOT DETECTED Final   Saja Bartolini.calcoaceticus-baumannii NOT DETECTED NOT DETECTED Final   Bacteroides fragilis NOT DETECTED NOT DETECTED Final   Enterobacterales NOT DETECTED NOT DETECTED Final   Enterobacter cloacae complex NOT DETECTED NOT DETECTED Final   Escherichia coli NOT DETECTED NOT DETECTED Final   Klebsiella aerogenes NOT DETECTED NOT DETECTED Final   Klebsiella oxytoca NOT DETECTED NOT DETECTED Final   Klebsiella pneumoniae NOT DETECTED NOT DETECTED Final   Proteus species NOT DETECTED NOT DETECTED Final   Salmonella species NOT DETECTED NOT DETECTED Final   Serratia marcescens NOT DETECTED NOT DETECTED Final   Haemophilus influenzae NOT DETECTED NOT DETECTED Final   Neisseria meningitidis NOT  DETECTED NOT DETECTED Final   Pseudomonas aeruginosa NOT DETECTED NOT DETECTED Final   Stenotrophomonas maltophilia NOT DETECTED NOT DETECTED Final   Candida albicans NOT DETECTED NOT DETECTED Final   Candida auris NOT DETECTED NOT DETECTED Final   Candida glabrata NOT DETECTED NOT DETECTED Final  Candida krusei NOT DETECTED NOT DETECTED Final   Candida parapsilosis NOT DETECTED NOT DETECTED Final   Candida tropicalis NOT DETECTED NOT DETECTED Final   Cryptococcus neoformans/gattii NOT DETECTED NOT DETECTED Final   Vancomycin  resistance NOT DETECTED NOT DETECTED Final    Comment: Performed at [facility], [address] [place], [place] [address]  Blood culture (routine x 2)     Status: None (Preliminary result)   Collection Time: 01/07/24 11:25 AM   Specimen: BLOOD  Result Value Ref Range Status   Specimen Description   Final    BLOOD LEFT ANTECUBITAL Performed at [facility], [address] [place], [place] [address]    Special Requests   Final    BOTTLES DRAWN AEROBIC AND ANAEROBIC Blood Culture adequate volume Performed at [facility], [address] [place], [place] [address]    Culture  Setup Time   Final    GRAM POSITIVE COCCI IN BOTH AEROBIC AND ANAEROBIC BOTTLES CRITICAL VALUE NOTED.  VALUE IS CONSISTENT WITH PREVIOUSLY REPORTED AND CALLED VALUE. Performed at [facility], [address], [place], [place] [address]    Culture GRAM POSITIVE COCCI  Final   Report Status PENDING  Incomplete  Culture, blood (Routine X 2) w Reflex to ID Panel     Status: None (Preliminary result)   Collection Time: 01/08/24  4:14 AM   Specimen: BLOOD RIGHT ARM  Result Value Ref Range Status   Specimen Description   Final    BLOOD RIGHT ARM Performed at [facility], [address] [place], [place] [address]    Special Requests   Final    BOTTLES DRAWN AEROBIC ONLY Blood Culture results may not be optimal due to an inadequate  volume of blood received in culture bottles Performed at [facility], [address], [place], [place] [address]    Culture   Final    NO GROWTH 1 DAY Performed at [facility], [address], [place], [place] [address]    Report Status PENDING  Incomplete  Culture, blood (Routine X 2) w Reflex to ID Panel     Status: None (Preliminary result)   Collection Time: 01/08/24  4:25 AM   Specimen: BLOOD LEFT HAND  Result Value Ref Range Status   Specimen Description   Final    BLOOD LEFT HAND Performed at [facility], [address] [place], [place] [address]    Special Requests   Final    BOTTLES DRAWN AEROBIC AND ANAEROBIC Blood Culture results may not be optimal due to an inadequate volume of blood received in culture bottles Performed at [facility], [address] [place], [place] [address]    Culture   Final    NO GROWTH 1 DAY Performed at [facility], [address], [place], [place] [address]    Report Status PENDING  Incomplete         Radiology Studies: MR BRAIN W WO CONTRAST Result Date: 01/07/2024 CLINICAL DATA:  Initial evaluation for brain abscess, gram-positive bacteremia, subacute endocarditis, headaches. EXAM: MRI HEAD WITHOUT AND WITH CONTRAST TECHNIQUE: Multiplanar, multiecho pulse sequences of the brain and surrounding structures were obtained without and with intravenous contrast. CONTRAST:  10mL GADAVIST  GADOBUTROL  1 MMOL/ML IV SOLN COMPARISON:  Prior head CT from 05/30/2009. FINDINGS: Brain: Cerebral volume within normal limits. Few scattered subcentimeter foci of T2/FLAIR hyperintensity noted involving the periventricular deep white  matter of both cerebral hemispheres, most pronounced about the frontal lobes, nonspecific, but most commonly related to mild chronic microvascular ischemic disease. Changes are mild in nature. Single punctate focus of diffusion signal abnormality seen involving the high left  frontal lobe (series 5, image 47), suspicious for Makeisha Jentsch tiny acute to subacute ischemic infarct. No associated hemorrhage. No other evidence for acute or subacute ischemia. Gray-white matter adjacent otherwise maintained. No acute or chronic intracranial blood products. No mass lesion, midline shift or mass effect. No hydrocephalus or extra-axial fluid collection. Pituitary gland suprasellar region within normal limits. No abnormal enhancement. No evidence for brain abscess or other CNS infection. Vascular: Major intracranial vascular flow voids are maintained. Skull and upper cervical spine: Craniocervical junction within normal limits. Bone marrow signal intensity normal. No scalp soft tissue abnormality. Sinuses/Orbits: Globes orbital soft tissues within normal limits. Mild scattered mucosal thickening noted about the ethmoidal air cells. Paranasal sinuses are otherwise clear. Trace right mastoid effusion noted, of doubtful significance. Other: None. IMPRESSION: 1. Single punctate focus of diffusion signal abnormality involving the high left frontal lobe, suspicious for Cu[place] Attar tiny acute to subacute ischemic infarct. No associated hemorrhage. 2. No other acute intracranial abnormality. No evidence for brain abscess or other CNS infection. 3. Underlying mild cerebral white matter disease, nonspecific, but most commonly related to chronic microvascular ischemic disease. Electronically Signed   By: Morene Hoard M.D.   On: 01/07/2024 20:47   US  Abdomen Limited RUQ (LIVER/GB) Result Date: 01/07/2024 CLINICAL DATA:  Transaminitis EXAM: ULTRASOUND ABDOMEN LIMITED RIGHT UPPER QUADRANT COMPARISON:  None Available. FINDINGS: Gallbladder: No gallstones or wall thickening visualized. No sonographic Murphy sign noted by sonographer. Common bile duct: Diameter: 2.5 mm Liver: No focal lesion identified. Within normal limits in parenchymal echogenicity. Portal vein is patent on color Doppler imaging with normal direction of  blood flow towards the liver. Other: None. IMPRESSION: No cholelithiasis or sonographic evidence for acute cholecystitis. Electronically Signed   By: Bard Moats M.D.   On: 01/07/2024 16:29   ECHOCARDIOGRAM COMPLETE Result Date: 01/07/2024    ECHOCARDIOGRAM REPORT   Patient Name:   DRESHON PROFFIT Date of Exam: 01/07/2024 Medical Rec #:  [id number]         Height:       72.0 in Accession #:    [id number]        Weight:       214.0 lb Date of Birth:  1961-06-13         BSA:          2.193 m Patient Age:    62 years          BP:           122/73 mmHg Patient Gender: M                 HR:           75 bpm. Exam Location:  Inpatient Procedure: 2D Echo, Cardiac Doppler and Color Doppler Indications:    Bacteremia R78.81  History:        Patient has no prior history of Echocardiogram examinations.                 Signs/Symptoms:Bacteremia.  Sonographer:    Lanell Maduro Referring Phys: [id number] DAVID MANUEL ORTIZ IMPRESSIONS  1. Left ventricular ejection fraction, by estimation, is 65 to 70%. The left ventricle has hyperdynamic function. The left ventricle has no regional wall motion abnormalities. There is mild asymmetric left ventricular hypertrophy  of the basal-septal segment. Left ventricular diastolic parameters were normal.  2. Right ventricular systolic function is normal. The right ventricular size is normal.  3. The mitral valve is normal in structure. Mild mitral valve regurgitation. No evidence of mitral stenosis.  4. Small mobile echodensity seen on the noncoronary cusp of the aortic valve, best seen in the parasternal long axis view. The aortic valve is normal in structure. Aortic valve regurgitation is mild. No aortic stenosis is present.  5. Aortic dilatation noted. 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  6. The inferior vena cava is normal in size with greater than 50% respiratory variability, suggesting  right atrial pressure of 3 mmHg. Conclusion(s)/Recommendation(s): Cannot exclude small aortic valve vegetation. Consider TEE if clinically indicated. FINDINGS  Left Ventricle: Left ventricular ejection fraction, by estimation, is 65 to 70%. The left ventricle has hyperdynamic function. The left ventricle has no regional wall motion abnormalities. The left ventricular internal cavity size was normal in size. There is mild asymmetric left ventricular hypertrophy of the basal-septal segment. Left ventricular diastolic parameters were normal. Right Ventricle: The right ventricular size is normal. No increase in right ventricular wall thickness. Right ventricular systolic function is normal. Left Atrium: Left atrial size was normal in size. Right Atrium: Right atrial size was normal in size. Pericardium: There is no evidence of pericardial effusion. Mitral Valve: The mitral valve is normal in structure. Mild mitral valve regurgitation. No evidence of mitral valve stenosis. There is no evidence of mitral valve vegetation. Tricuspid Valve: The tricuspid valve is normal in structure. Tricuspid valve regurgitation is not demonstrated. No evidence of tricuspid stenosis. Aortic Valve: Small mobile echodensity seen on the noncoronary cusp of the aortic valve, best seen in the parasternal long axis view. The aortic valve is normal in structure. Aortic valve regurgitation is mild. Aortic regurgitation PHT measures 562 msec.  No aortic stenosis is present. Pulmonic Valve: The pulmonic valve was normal in structure. Pulmonic valve regurgitation is not visualized. No evidence of pulmonic stenosis. Aorta: Aortic dilatation noted. 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 Venous: The inferior vena cava is normal in size with greater than 50% respiratory variability, suggesting right atrial pressure of 3 mmHg. IAS/Shunts: No atrial level shunt  detected by color flow Doppler.  LEFT VENTRICLE PLAX 2D LVIDd:         4.60 cm     Diastology LVIDs:         2.70 cm     LV e' medial:    6.85 cm/s LV PW:         1.10 cm     LV E/e' medial:  12.6 LV IVS:        1.40 cm     LV e' lateral:   8.49 cm/s LVOT diam:     2.40 cm     LV E/e' lateral: 10.2 LV SV:         127 LV SV Index:   58 LVOT Area:     4.52 cm  LV Volumes (MOD) LV vol d, MOD A2C: 68.0 ml LV vol d, MOD A4C: 80.0 ml LV vol s, MOD A2C: 20.8 ml LV vol s, MOD A4C: 23.1 ml LV SV MOD A2C:     47.2 ml LV SV MOD A4C:     80.0 ml LV  SV MOD BP:      51.4 ml RIGHT VENTRICLE RV Basal diam:  3.20 cm RV S prime:     16.30 cm/s TAPSE (M-mode): 3.0 cm LEFT ATRIUM           Index        RIGHT ATRIUM           Index LA diam:      3.80 cm 1.73 cm/m   RA Area:     10.30 cm LA Vol (A2C): 65.6 ml 29.92 ml/m  RA Volume:   17.20 ml  7.84 ml/m LA Vol (A4C): 43.0 ml 19.61 ml/m  AORTIC VALVE LVOT Vmax:   144.00 cm/s LVOT Vmean:  101.000 cm/s LVOT VTI:    0.280 m AI PHT:      562 msec  AORTA Ao Root diam: 4.10 cm Ao Asc diam:  4.35 cm MITRAL VALVE MV Area (PHT): 3.60 cm    SHUNTS MV Decel Time: 211 msec    Systemic VTI:  0.28 m MV E velocity: 86.60 cm/s  Systemic Diam: 2.40 cm MV Myleka Moncure velocity: 60.90 cm/s MV E/Lew Prout ratio:  1.42 Morene Brownie Electronically signed by Morene Brownie Signature Date/Time: 01/07/2024/4:05:19 PM    Final    DG Chest 2 View Result Date: 01/07/2024 CLINICAL DATA:  Bacteremia, fever EXAM: CHEST - 2 VIEW COMPARISON:  None Available. FINDINGS: The heart size and mediastinal contours are within normal limits. Both lungs are clear. No visible pleural effusions or pneumothorax. No acute osseous abnormality. IMPRESSION: No active cardiopulmonary disease. Electronically Signed   By: Gilmore GORMAN Molt M.D.   On: 01/07/2024 12:26        Scheduled Meds:  enoxaparin  (LOVENOX ) injection  40 mg Subcutaneous Q24H   metoprolol  succinate  50 mg Oral Daily   Continuous Infusions:  vancomycin  1,250 mg  (01/09/24 1124)     LOS: 2 days    Time spent: over 30 min    Meliton Monte, MD [facility]   To contact the attending provider between 7A-7P or the covering provider during after hours 7P-7A, please log into the web site [email or web address] and access using universal Pass [place] password for that web site. If you do not have the password, please call the hospital operator.  01/09/2024, 11:33 AM

## 2024-01-09 NOTE — Plan of Care (Signed)

## 2024-01-10 ENCOUNTER — Inpatient Hospital Stay (HOSPITAL_COMMUNITY): Payer: 59 | Admitting: Anesthesiology

## 2024-01-10 ENCOUNTER — Encounter (HOSPITAL_COMMUNITY)
Admission: EM | Disposition: A | Discharge status: Home Health | Payer: Self-pay | Source: Home / Self Care | Attending: Family Medicine

## 2024-01-10 ENCOUNTER — Encounter (HOSPITAL_COMMUNITY): Payer: Self-pay | Admitting: Cardiology

## 2024-01-10 ENCOUNTER — Inpatient Hospital Stay (HOSPITAL_COMMUNITY): Payer: 59

## 2024-01-10 DIAGNOSIS — I10 Essential (primary) hypertension: Secondary | ICD-10-CM

## 2024-01-10 DIAGNOSIS — I351 Nonrheumatic aortic (valve) insufficiency: Secondary | ICD-10-CM | POA: Diagnosis not present

## 2024-01-10 DIAGNOSIS — I340 Nonrheumatic mitral (valve) insufficiency: Secondary | ICD-10-CM

## 2024-01-10 DIAGNOSIS — R7881 Bacteremia: Secondary | ICD-10-CM

## 2024-01-10 DIAGNOSIS — I471 Supraventricular tachycardia, unspecified: Secondary | ICD-10-CM

## 2024-01-10 HISTORY — PX: TRANSESOPHAGEAL ECHOCARDIOGRAM (CATH LAB): EP1270

## 2024-01-10 LAB — CULTURE, BLOOD (SINGLE)
MICRO NUMBER:: 16052081
Result:: POSITIVE

## 2024-01-10 LAB — CULTURE, BLOOD (ROUTINE X 2)
Special Requests: ADEQUATE
Special Requests: ADEQUATE

## 2024-01-10 LAB — ECHO TEE: P 1/2 time: 346 ms

## 2024-01-10 LAB — PROTIME-INR
INR: 1.1 (ref 0.8–1.2)
Prothrombin Time: 14 s (ref 11.4–15.2)

## 2024-01-10 SURGERY — TRANSESOPHAGEAL ECHOCARDIOGRAM (TEE) (CATHLAB)
Anesthesia: Monitor Anesthesia Care

## 2024-01-10 MED ORDER — SODIUM CHLORIDE 0.9 % IV SOLN
2.0000 g | Freq: Two times a day (BID) | INTRAVENOUS | Status: DC
  Administered 2024-01-10 – 2024-01-12 (×5): 2 g via INTRAVENOUS
  Filled 2024-01-10 (×5): qty 20

## 2024-01-10 MED ORDER — SODIUM CHLORIDE 0.9% FLUSH: 3.0000 mL | INTRAVENOUS | Status: DC | PRN

## 2024-01-10 MED ORDER — SODIUM CHLORIDE 0.9 % IV SOLN: INTRAVENOUS | Status: DC | PRN

## 2024-01-10 MED ORDER — AMPICILLIN SODIUM 2 G IJ SOLR
2.0000 g | INTRAMUSCULAR | Status: DC
  Administered 2024-01-10 – 2024-01-12 (×13): 2 g via INTRAVENOUS
  Filled 2024-01-10 (×15): qty 2000

## 2024-01-10 MED ORDER — SODIUM CHLORIDE 0.9% FLUSH
3.0000 mL | Freq: Two times a day (BID) | INTRAVENOUS | Status: DC
  Administered 2024-01-10: 10 mL via INTRAVENOUS

## 2024-01-10 MED ORDER — PROPOFOL 500 MG/50ML IV EMUL
INTRAVENOUS | Status: DC | PRN
  Administered 2024-01-10: 75 mg via INTRAVENOUS
  Administered 2024-01-10: 250 ug/kg/min via INTRAVENOUS

## 2024-01-10 NOTE — Transfer of Care (Addendum)
 Immediate Anesthesia Transfer of Care Note  Patient: Robert Madden  Procedure(s) Performed: TRANSESOPHAGEAL ECHOCARDIOGRAM  Patient Location: PACU and Cath Lab  Anesthesia Type:General  Level of Consciousness: drowsy  Airway & Oxygen Therapy: Patient Spontanous Breathing and Patient connected to nasal cannula oxygen  Post-op Assessment: Report given to RN and Post -op Vital signs reviewed and stable  Post vital signs: Reviewed and stable  Last Vitals:  Vitals Value Taken Time  BP 99/63 01/10/24 1221  Temp 36.7 C 01/10/24 1221  Pulse 67 01/10/24 1224  Resp 0 01/10/24 1224  SpO2 95 % 01/10/24 1224  Vitals shown include unfiled device data.  Last Pain:  Vitals:   01/10/24 1221  TempSrc: Tympanic  PainSc: Asleep         Complications: No notable events documented.

## 2024-01-10 NOTE — Interval H&P Note (Signed)
 History and Physical Interval Note:  01/10/2024 11:38 AM  Robert Madden  has presented today for surgery, with the diagnosis of bacteremia.  The various methods of treatment have been discussed with the patient and family. After consideration of risks, benefits and other options for treatment, the patient has consented to  Procedure(s): TRANSESOPHAGEAL ECHOCARDIOGRAM (N/A) as a surgical intervention.  The patient's history has been reviewed, patient examined, no change in status, stable for surgery.  I have reviewed the patient's chart and labs.  Questions were answered to the patient's satisfaction.     Wendie Hamburg

## 2024-01-10 NOTE — Plan of Care (Signed)

## 2024-01-10 NOTE — Progress Notes (Signed)
   [facility] has been requested to perform a transesophageal echocardiogram on Robert Madden for bacteremia.     The patient does NOT have any absolute or relative contraindications to a Transesophageal Echocardiogram (TEE).  The patient has: No other conditions that may impact this procedure.    After careful review of history and examination, the risks and benefits of transesophageal echocardiogram have been explained including risks of esophageal damage, perforation (1:10,000 risk), bleeding, pharyngeal hematoma as well as other potential complications associated with conscious sedation including aspiration, arrhythmia, respiratory failure and death. Alternatives to treatment were discussed, questions were answered. Patient is willing to proceed.    Check INR. TEE planned today, NPO, orders placed.     Signed, Dellis Fermo, NP  01/10/2024 8:17 AM

## 2024-01-10 NOTE — Anesthesia Procedure Notes (Signed)
 Procedure Name: MAC Date/Time: 01/10/2024 11:40 AM  Performed by: Eloisa Hait, CRNAPre-anesthesia Checklist: Patient identified, Emergency Drugs available, Suction available, Patient being monitored and Timeout performed Patient Re-evaluated:Patient Re-evaluated prior to induction Preoxygenation: Pre-oxygenation with 100% oxygen Induction Type: IV induction Placement Confirmation: positive ETCO2 Dental Injury: Teeth and Oropharynx as per pre-operative assessment

## 2024-01-10 NOTE — Progress Notes (Addendum)
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 - Blood cultures are CIBA grew E faecalis.  Patient on vancomycin. - TTE showed concern for mobile mass on native aortic valve, plan for TEE.  MRI brain 9 showed signal abnormality tiny acute to subacute ischemic infarct in high left frontal temporal lobe.  I agree this is more consistent with infarct rather than emboli  #Ampicillin allergy #Back Rash - Patient states ampicillin allergy was enforced.  Developed rash no shortness of breath.  Amenable to trying ampicillin patient.  Also there is some concern of a rash that developed after vancomycin on his back which is pruritic following first dose of antibiotics to admission. Does not seem  typical of drug rash.   Recommendations: -DC vancomycin - Start ceftriaxone and ampicillin - Follow repeat blood cultures ventricular clearance - TEE - Follow-up hepatitis serologies   Microbiology:   Antibiotics: Vancomycin Cultures: Blood 2/62/2E faecalis 2/72/2E faecalis 2/8 pending Urine  Other   SUBJECTIVE: Sitting in bed.  No new complaints. Interval: Afebrile overnight.  Review of Systems: Review of Systems  All other systems reviewed and are negative.    Scheduled Meds:  enoxaparin (LOVENOX) injection  40 mg Subcutaneous Q24H   metoprolol succinate  50 mg  Oral Daily   Continuous Infusions:  ampicillin (OMNIPEN) IV Stopped (01/10/24 1405)   cefTRIAXone (ROCEPHIN)  IV Stopped (01/10/24 1452)   PRN Meds:.acetaminophen **OR** acetaminophen, diphenhydrAMINE, ondansetron **OR** ondansetron (ZOFRAN) IV Allergies  Allergen Reactions   Atorvastatin     Other reaction(s): joint aches   Simvastatin Other (See Comments)    Joint aches   Ampicillin Rash    Pt describes itchy rash in 4th grade.  No throat swelling or breathing problems.     Honey Rash    OBJECTIVE: Vitals:   01/10/24 1221 01/10/24 1231 01/10/24 1241 01/10/24 1341  BP: 99/63 103/70 117/71 127/73  Pulse: 68 69 70 75  Resp: 17 18 12 20   Temp: 98 F (36.7 C)   98.7 F (37.1 C)  TempSrc: Tympanic   Oral  SpO2: 92% 96% 97% 99%  Weight:      Height:       Body mass index is 29.02 kg/m.  Physical Exam Constitutional:      General: He is not in acute distress.    Appearance: He is normal weight. He is not toxic-appearing.  HENT:     Head: Normocephalic and atraumatic.     Right Ear: External ear normal.     Left Ear: External ear normal.     Nose: No congestion or rhinorrhea.     Mouth/Throat:     Mouth: Mucous membranes are moist.     Pharynx: Oropharynx is clear.  Eyes:     Extraocular Movements: Extraocular movements intact.     Conjunctiva/sclera: Conjunctivae normal.     Pupils: Pupils  are equal, round, and reactive to light.  Cardiovascular:     Rate and Rhythm: Normal rate and regular rhythm.     Heart sounds: No murmur heard.    No friction rub. No gallop.  Pulmonary:     Effort: Pulmonary effort is normal.     Breath sounds: Normal breath sounds.  Abdominal:     General: Abdomen is flat. Bowel sounds are normal.     Palpations: Abdomen is soft.  Musculoskeletal:        General: No swelling. Normal range of motion.     Cervical back: Normal range of motion and neck supple.  Skin:    General: Skin is warm and dry.  Neurological:     General: No focal  deficit present.     Mental Status: He is oriented to person, place, and time.  Psychiatric:        Mood and Affect: Mood normal.       Lab Results Lab Results  Component Value Date   WBC 6.0 01/09/2024   HGB 11.9 (L) 01/09/2024   HCT 36.5 (L) 01/09/2024   MCV 88.6 01/09/2024   PLT 236 01/09/2024    Lab Results  Component Value Date   CREATININE 1.02 01/09/2024   BUN 16 01/09/2024   NA 137 01/09/2024   K 4.3 01/09/2024   CL 103 01/09/2024   CO2 25 01/09/2024    Lab Results  Component Value Date   ALT 50 (H) 01/09/2024   AST 25 01/09/2024   ALKPHOS 60 01/09/2024   BILITOT 0.7 01/09/2024        Danelle Earthly, MD [facility] 01/10/2024, 3:28 PM  I have personally spent 56 minutes involved in face-to-face and non-face-to-face activities for this patient on the day of the visit. Professional time spent includes the following activities: Preparing to see the patient (review of tests), Obtaining and/or reviewing separately obtained history (admission/discharge record), Performing a medically appropriate examination and/or evaluation , Ordering medications/tests/procedures, referring and communicating with other health care professionals, Documenting clinical information in the EMR, Independently interpreting results (not separately reported), Communicating results to the patient/family/caregiver, Counseling and educating the patient/family/caregiver and Care coordination (not separately reported).   Evaluation of this patient requires complex antimicrobial therapy evaluation and counseling + isolation needs for disease transmission risk assessment and mitigation

## 2024-01-10 NOTE — Anesthesia Preprocedure Evaluation (Signed)
 Anesthesia Evaluation  Patient identified by MRN, date of birth, ID band Patient awake    Reviewed: Allergy & Precautions, NPO status , Patient's Chart, lab work & pertinent test results  Airway Mallampati: II  TM Distance: >3 FB Neck ROM: Full    Dental   Pulmonary neg pulmonary ROS   Pulmonary exam normal        Cardiovascular hypertension, Pt. on home beta blockers and Pt. on medications Normal cardiovascular exam     Neuro/Psych negative neurological ROS     GI/Hepatic negative GI ROS, Neg liver ROS,,,  Endo/Other  negative endocrine ROS    Renal/GU negative Renal ROS     Musculoskeletal   Abdominal   Peds  Hematology  (+) Blood dyscrasia, anemia   Anesthesia Other Findings   Reproductive/Obstetrics                             Anesthesia Physical Anesthesia Plan  ASA: 3  Anesthesia Plan: MAC   Post-op Pain Management:    Induction:   PONV Risk Score and Plan: 1 and Propofol  infusion  Airway Management Planned: Natural Airway and Nasal Cannula  Additional Equipment:   Intra-op Plan:   Post-operative Plan:   Informed Consent: I have reviewed the patients History and Physical, chart, labs and discussed the procedure including the risks, benefits and alternatives for the proposed anesthesia with the patient or authorized representative who has indicated his/her understanding and acceptance.       Plan Discussed with:   Anesthesia Plan Comments:        Anesthesia Quick Evaluation

## 2024-01-10 NOTE — CV Procedure (Signed)
     TRANSESOPHAGEAL ECHOCARDIOGRAM   NAME:  Robert Madden   MRN: [id number] DOB:  1960-12-07   ADMIT DATE: 01/07/2024  INDICATIONS: Bacteremia  PROCEDURE:   Informed consent was obtained prior to the procedure. The risks, benefits and alternatives for the procedure were discussed and the patient comprehended these risks.  Risks include, but are not limited to, cough, sore throat, vomiting, nausea, somnolence, esophageal and stomach trauma or perforation, bleeding, low blood pressure, aspiration, pneumonia, infection, trauma to the teeth and death.    After a procedural time-out, the oropharynx was anesthetized and the patient was sedated by the anesthesia service. The transesophageal probe was inserted in the esophagus and stomach without difficulty and multiple views were obtained. Anesthesia was monitored by Dr Harwood Lingo and Kennis Peacock, CRNA.    COMPLICATIONS:    There were no immediate complications.  FINDINGS:  Small echodensity on noncoronay cusp of aortic valve measuring 0.5cm, concerning for vegetation.  Moderate AI.  Aortic root aneurysm measuring 46mm   [name] MD [facility]  [address] [place], [place] [address] [phone]   5:21 PM

## 2024-01-10 NOTE — TOC Initial Note (Signed)
 Transition of Care [facility]) - Initial/Assessment Note   Patient Details  Name: Robert Madden MRN: [id number] Date of Birth: Apr 01, 1961  [facility] CM/SW Contact:    Zenon Hilda, LCSW Phone Number: 01/10/2024, 1:48 PM  Clinical Narrative: Patient is from home with spouse. Patient had TEE. TOC following for possible discharge needs.  Expected Discharge Plan: Home/Self Care Barriers to Discharge: Continued Medical Work up  Expected Discharge Plan and Services In-house Referral: Clinical Social Work Living arrangements for the past 2 months: Single Family Home            DME Arranged: N/A DME Agency: NA  Prior Living Arrangements/Services Living arrangements for the past 2 months: Single Family Home Lives with:: Spouse Patient language and need for interpreter reviewed:: Yes Do you feel safe going back to the place where you live?: Yes      Need for Family Participation in Patient Care: No (Comment) Care giver support system in place?: Yes (comment) Criminal Activity/Legal Involvement Pertinent to Current Situation/Hospitalization: No - Comment as needed  Activities of Daily Living ADL Screening (condition at time of admission) Independently performs ADLs?: Yes (appropriate for developmental age) Is the patient deaf or have difficulty hearing?: No Does the patient have difficulty seeing, even when wearing glasses/contacts?: No Does the patient have difficulty concentrating, remembering, or making decisions?: No  Emotional Assessment Orientation: : Oriented to Self, Oriented to Place, Oriented to  Time, Oriented to Situation Alcohol / Substance Use: Not Applicable Psych Involvement: No (comment)  Admission diagnosis:  Bacteremia [R78.81] Patient Active Problem List   Diagnosis Date Noted   Bacteremia 01/07/2024   Abnormal LFTs 01/07/2024   Hyponatremia 01/07/2024   Hypocarbia 01/07/2024   Irritable bowel syndrome without diarrhea 11/20/2020   Aortic root  dilatation (HCC) 11/20/2020   SVT (supraventricular tachycardia) (HCC) 08/30/2015   Hypercholesterolemia without hypertriglyceridemia 02/25/2015   Aortic aneurysm, thoracic (HCC) 12/06/2013   PCP:  Brenita Callow, L.Rozelle Corning, MD (Inactive) Pharmacy:   [id number] [place], [place] - [phone] AT [facility] OF [facility] & [address] [place] [address] Phone: [phone] Fax: [phone]  Social Drivers of Health (SDOH) Social History: SDOH Screenings   Food Insecurity: No Food Insecurity (01/07/2024)  Housing: Low Risk  (01/07/2024)  Transportation Needs: No Transportation Needs (01/07/2024)  Utilities: Not At Risk (01/07/2024)  Depression (PHQ2-9): Low Risk  (01/25/2019)  Tobacco Use: Low Risk  (01/07/2024)   SDOH Interventions:    Readmission Risk Interventions     No data to display

## 2024-01-10 NOTE — Progress Notes (Signed)
 PROGRESS NOTE    Robert Madden  ZOX:[id number] DOB: [birth year] DOA: 01/07/2024 PCP: Brenita Callow, L.Rozelle Corning, MD (Inactive)  Chief Complaint  Patient presents with   Fever    Brief Narrative:    Robert Madden is Robert Madden 63 y.o. male with medical history significant of aortic insufficiency, aortic root dilation, thoracic aortic aneurysm, hyperlipidemia, IBS, paroxysmal tachycardia who has been having fever associated with malaise for the past 3 weeks and has been referred by ID to the emergency department for admission due to blood cultures growing gram-positive bacteria.   Assessment & Plan:   Principal Problem:   Bacteremia Active Problems:   Aortic aneurysm, thoracic (HCC)   SVT (supraventricular tachycardia) (HCC)   Hypercholesterolemia without hypertriglyceridemia   Abnormal LFTs   Hyponatremia   Hypocarbia  Enterococcus Faecalis Bacteremia Concern for Endocarditis Blood culture 2/7 with enterococcus faecalis on BCID -> amp sensitive  Repeat 2/8 cultures NGx1  Echo with small mobile echodensity on noncoronary cusp of aortic valve - can't excluded small aortic valve vegetation Planning for TEE, sounds like Monday 2/10 - results pending MRI brain without evidence for brain abscess or CNS infection Appreciate ID assistance, abx per ID   MRI brain with single punctate focus of diffusion signal abnormality involving the high left frontal lobe, concerning for tiny acute to subacute ischemic infarct Will discuss with neurology -> see "plan of care" note from 2/8, finding thought to represent artifact  Elevated LFT's  Mild RUQ US  without cholelithiasis or cholecystitis  Negative hep Robert Madden Ab Hb B surface Ab pending  Aortic Aneurysm Outpatient follow up  SVT Metoprolol     DVT prophylaxis: lovenox  Code Status: full Family Communication: wife at bedside Disposition:   Status is: Inpatient Remains inpatient appropriate because: need for continued inpatient care   Consultants:   ID  Procedures:  Echo IMPRESSIONS     1. Left ventricular ejection fraction, by estimation, is 65 to 70%. The  left ventricle has hyperdynamic function. The left ventricle has no  regional wall motion abnormalities. There is mild asymmetric left  ventricular hypertrophy of the basal-septal  segment. Left ventricular diastolic parameters were normal.   2. Right ventricular systolic function is normal. The right ventricular  size is normal.   3. The mitral valve is normal in structure. Mild mitral valve  regurgitation. No evidence of mitral stenosis.   4. Small mobile echodensity seen on the noncoronary cusp of the aortic  valve, best seen in the parasternal long axis view. The aortic valve is  normal in structure. Aortic valve regurgitation is mild. No aortic  stenosis is present.   5. Aortic dilatation noted. There is moderate dilatation of the aortic  root, measuring 43 mm. There is moderate dilatation of the ascending  aorta, measuring 44 mm.   6. The inferior vena cava is normal in size with greater than 50%  respiratory variability, suggesting right atrial pressure of 3 mmHg.   Conclusion(s)/Recommendation(s): Cannot exclude small aortic valve  vegetation. Consider TEE if clinically indicated.   Antimicrobials:  Anti-infectives (From admission, onward)    Start     Dose/Rate Route Frequency Ordered Stop   01/10/24 1130  ampicillin  (OMNIPEN) 2 g in sodium chloride  0.9 % 100 mL IVPB        2 g 300 mL/hr over 20 Minutes Intravenous Every 4 hours 01/10/24 1033     01/10/24 1130  cefTRIAXone  (ROCEPHIN ) 2 g in sodium chloride  0.9 % 100 mL IVPB  2 g 200 mL/hr over 30 Minutes Intravenous Every 12 hours 01/10/24 1033     01/08/24 0100  vancomycin  (VANCOCIN ) IVPB 1000 mg/200 mL premix  Status:  Discontinued        1,000 mg 200 mL/hr over 60 Minutes Intravenous Every 12 hours 01/07/24 1159 01/07/24 1515   01/07/24 2300  DAPTOmycin  (CUBICIN ) 800 mg in sodium chloride  0.9 %  IVPB  Status:  Discontinued        8 mg/kg  97.1 kg 132 mL/hr over 30 Minutes Intravenous Daily 01/07/24 1515 01/07/24 1522   01/07/24 2300  DAPTOmycin  (CUBICIN ) IVPB 700 mg/114mL premix  Status:  Discontinued        8 mg/kg  85.4 kg (Adjusted) 200 mL/hr over 30 Minutes Intravenous Daily 01/07/24 1522 01/07/24 1525   01/07/24 2300  vancomycin  (VANCOREADY) IVPB 1250 mg/250 mL  Status:  Discontinued        1,250 mg 166.7 mL/hr over 90 Minutes Intravenous Every 12 hours 01/07/24 1528 01/10/24 1033   01/07/24 1145  vancomycin  (VANCOREADY) IVPB 2000 mg/400 mL        2,000 mg 200 mL/hr over 120 Minutes Intravenous  Once 01/07/24 1134 01/07/24 1409       Subjective: No complaints  Objective: Vitals:   01/10/24 1221 01/10/24 1231 01/10/24 1241 01/10/24 1341  BP: 99/63 103/70 117/71 127/73  Pulse: 68 69 70 75  Resp: 17 18 12 20   Temp: 98 F (36.7 C)   98.7 F (37.1 C)  TempSrc: Tympanic   Oral  SpO2: 92% 96% 97% 99%  Weight:      Height:        Intake/Output Summary (Last 24 hours) at 01/10/2024 1649 Last data filed at 01/10/2024 1459 Gross per 24 hour  Intake 330 ml  Output --  Net 330 ml    Filed Weights   01/07/24 1019  Weight: 97.1 kg    Examination:  General: No acute distress. Lungs: unlabored Neurological: Alert and oriented 3. Moves all extremities 4 with equal strength. Cranial nerves II through XII grossly intact. Extremities: No clubbing or cyanosis. No edema.  Data Reviewed: I have personally reviewed following labs and imaging studies  CBC: Recent Labs  Lab 01/07/24 1053 01/08/24 0425 01/09/24 0412  WBC 7.8 7.1 6.0  NEUTROABS 5.5  --  3.6  HGB 13.5 12.3* 11.9*  HCT 40.2 36.9* 36.5*  MCV 86.8 88.5 88.6  PLT 298 258 236    Basic Metabolic Panel: Recent Labs  Lab 01/07/24 1053 01/08/24 0425 01/09/24 0412  NA 133* 134* 137  K 4.2 4.0 4.3  CL 101 104 103  CO2 21* 21* 25  GLUCOSE 96 116* 107*  BUN 16 17 16   CREATININE 1.17 1.03 1.02   CALCIUM 9.0 8.7* 8.7*  MG  --   --  2.4  PHOS  --   --  4.1    GFR: Estimated Creatinine Clearance: 93 mL/min (by C-G formula based on SCr of 1.02 mg/dL).  Liver Function Tests: Recent Labs  Lab 01/07/24 1053 01/07/24 2136 01/08/24 0425 01/09/24 0412  AST 33 33 29 25  ALT 63* 61* 58* 50*  ALKPHOS 70 68 64 60  BILITOT 1.2 0.8 0.9 0.7  PROT 7.7 7.5 7.4 7.0  ALBUMIN 3.6 3.4* 3.5 3.2*    CBG: No results for input(s): "GLUCAP" in the last 168 hours.   Recent Results (from the past 240 hours)  Culture, blood (single) w Reflex to ID Panel     Status:  Abnormal   Collection Time: 01/06/24 10:43 AM   Specimen: Blood  Result Value Ref Range Status   MICRO NUMBER: [id number]  Final   SPECIMEN QUALITY: Suboptimal  Final   Source BLOOD 1  Final   STATUS: FINAL  Final   Result:   Final    Inspection of blood culture bottles indicates that an inadequate volume of blood may have been collected for the detection of sepsis.   ISOLATE 1: Enterococcus faecalis (AA)  Final    Comment: Enterococcus faecalis , from aerobic and anaerobic bottles   COMMENT: Aerobic and anaerobic bottle received.  Final      Susceptibility   Enterococcus faecalis - BLOOD CULTURE, POSITIVE 1    AMPICILLIN  <=2 Sensitive     VANCOMYCIN  2 Sensitive     GENTAMICIN SYNERGY*  Sensitive      * This organism does not exhibit high level resistance to gentamicin which indicates that synergy of gentamicin with Darrik Richman penicillin is likely.     STREPTOMYCIN*  Sensitive      * This organism does not exhibit high level resistance to gentamicin which indicates that synergy of gentamicin with Jaysiah Marchetta penicillin is likely. This organism does not exhibit high level resistance to streptomycin which indicates that synergy of streptomycin with Oday Ridings penicillin is likely. Legend: S = Susceptible  I = Intermediate R = Resistant  NS = Not susceptible SDD = Susceptible Dose Dependent * = Not Tested  NR = Not Reported **NN = See Therapy  Comments   Culture, blood (single) w Reflex to ID Panel     Status: Abnormal   Collection Time: 01/06/24 10:46 AM   Specimen: Blood  Result Value Ref Range Status   MICRO NUMBER: [id number]  Final   SPECIMEN QUALITY: Adequate  Final   Source BLOOD 2  Final   STATUS: FINAL  Final   ISOLATE 1: Enterococcus faecalis (AA)  Final    Comment: Enterococcus faecalis , from aerobic and anaerobic bottles   COMMENT: Aerobic and anaerobic bottle received.  Final      Susceptibility   Enterococcus faecalis - BLOOD CULTURE, POSITIVE 1    AMPICILLIN  <=2 Sensitive     VANCOMYCIN  2 Sensitive     GENTAMICIN SYNERGY*  Sensitive      * This organism does not exhibit high level resistance to gentamicin which indicates that synergy of gentamicin with Mordecai Tindol penicillin is likely.     STREPTOMYCIN*  Sensitive      * This organism does not exhibit high level resistance to gentamicin which indicates that synergy of gentamicin with Laiklynn Raczynski penicillin is likely. This organism does not exhibit high level resistance to streptomycin which indicates that synergy of streptomycin with Kalila Adkison penicillin is likely. Legend: S = Susceptible  I = Intermediate R = Resistant  NS = Not susceptible SDD = Susceptible Dose Dependent * = Not Tested  NR = Not Reported **NN = See Therapy Comments   Blood culture (routine x 2)     Status: Abnormal   Collection Time: 01/07/24 10:51 AM   Specimen: BLOOD  Result Value Ref Range Status   Specimen Description   Final    BLOOD LEFT ANTECUBITAL Performed at [facility], [address] [place], [place] [address]    Special Requests   Final    BOTTLES DRAWN AEROBIC AND ANAEROBIC Blood Culture adequate volume Performed at [facility], [address] [place], [place] [address]    Culture  Setup Time  Final    GRAM POSITIVE COCCI IN BOTH AEROBIC AND ANAEROBIC BOTTLES CRITICAL RESULT CALLED TO, READ BACK BY AND VERIFIED WITH: PHARMD C SHADE 2/0/2025  @ 0725 BY AB Performed at [facility], [address], [place], [place] [address]    Culture ENTEROCOCCUS FAECALIS (Antaniya Venuti)  Final   Report Status 01/10/2024 FINAL  Final   Organism ID, Bacteria ENTEROCOCCUS FAECALIS  Final      Susceptibility   Enterococcus faecalis - MIC*    AMPICILLIN  <=2 SENSITIVE Sensitive     VANCOMYCIN  2 SENSITIVE Sensitive     GENTAMICIN SYNERGY SENSITIVE Sensitive     * ENTEROCOCCUS FAECALIS  Blood Culture ID Panel (Reflexed)     Status: Abnormal   Collection Time: 01/07/24 10:51 AM  Result Value Ref Range Status   Enterococcus faecalis DETECTED (Adrin Julian) NOT DETECTED Final    Comment: CRITICAL RESULT CALLED TO, READ BACK BY AND VERIFIED WITH: PHARMD C SHADE 2/0/2025 @ 0725 BY AB    Enterococcus Faecium NOT DETECTED NOT DETECTED Final   Listeria monocytogenes NOT DETECTED NOT DETECTED Final   Staphylococcus species NOT DETECTED NOT DETECTED Final   Staphylococcus aureus (BCID) NOT DETECTED NOT DETECTED Final   Staphylococcus epidermidis NOT DETECTED NOT DETECTED Final   Staphylococcus lugdunensis NOT DETECTED NOT DETECTED Final   Streptococcus species NOT DETECTED NOT DETECTED Final   Streptococcus agalactiae NOT DETECTED NOT DETECTED Final   Streptococcus pneumoniae NOT DETECTED NOT DETECTED Final   Streptococcus pyogenes NOT DETECTED NOT DETECTED Final   Quaid Yeakle.calcoaceticus-baumannii NOT DETECTED NOT DETECTED Final   Bacteroides fragilis NOT DETECTED NOT DETECTED Final   Enterobacterales NOT DETECTED NOT DETECTED Final   Enterobacter cloacae complex NOT DETECTED NOT DETECTED Final   Escherichia coli NOT DETECTED NOT DETECTED Final   Klebsiella aerogenes NOT DETECTED NOT DETECTED Final   Klebsiella oxytoca NOT DETECTED NOT DETECTED Final   Klebsiella pneumoniae NOT DETECTED NOT DETECTED Final   Proteus species NOT DETECTED NOT DETECTED Final   Salmonella species NOT DETECTED NOT DETECTED Final   Serratia marcescens NOT DETECTED NOT DETECTED Final    Haemophilus influenzae NOT DETECTED NOT DETECTED Final   Neisseria meningitidis NOT DETECTED NOT DETECTED Final   Pseudomonas aeruginosa NOT DETECTED NOT DETECTED Final   Stenotrophomonas maltophilia NOT DETECTED NOT DETECTED Final   Candida albicans NOT DETECTED NOT DETECTED Final   Candida auris NOT DETECTED NOT DETECTED Final   Candida glabrata NOT DETECTED NOT DETECTED Final   Candida krusei NOT DETECTED NOT DETECTED Final   Candida parapsilosis NOT DETECTED NOT DETECTED Final   Candida tropicalis NOT DETECTED NOT DETECTED Final   Cryptococcus neoformans/gattii NOT DETECTED NOT DETECTED Final   Vancomycin  resistance NOT DETECTED NOT DETECTED Final    Comment: Performed at [facility], [address], [place], [place] [address]  Blood culture (routine x 2)     Status: Abnormal   Collection Time: 01/07/24 11:25 AM   Specimen: BLOOD  Result Value Ref Range Status   Specimen Description   Final    BLOOD LEFT ANTECUBITAL Performed at [facility], [address], [place], [place] [address]    Special Requests   Final    BOTTLES DRAWN AEROBIC AND ANAEROBIC Blood Culture adequate volume Performed at [facility], [address], [place], [place] [address]    Culture  Setup Time   Final    GRAM POSITIVE COCCI IN BOTH AEROBIC AND ANAEROBIC BOTTLES CRITICAL VALUE NOTED.  VALUE  IS CONSISTENT WITH PREVIOUSLY REPORTED AND CALLED VALUE.    Culture (Attie Nawabi)  Final    ENTEROCOCCUS FAECALIS SUSCEPTIBILITIES PERFORMED ON PREVIOUS CULTURE WITHIN THE LAST 5 DAYS. Performed at [facility], [address], [place], [place] [address]    Report Status 01/10/2024 FINAL  Final  Culture, blood (Routine X 2) w Reflex to ID Panel     Status: None (Preliminary result)   Collection Time: 01/08/24  4:14 AM   Specimen: BLOOD RIGHT ARM  Result Value Ref Range Status   Specimen Description   Final    BLOOD RIGHT ARM Performed at [facility], [address], [place], [place] [address]    Special Requests   Final    BOTTLES DRAWN AEROBIC ONLY Blood Culture results may not be optimal due to an inadequate volume of blood received in culture bottles Performed at [facility], [address], [place], [place] [address]    Culture   Final    NO GROWTH 1 DAY Performed at [facility], [address] [place], [place] [address]    Report Status PENDING  Incomplete  Culture, blood (Routine X 2) w Reflex to ID Panel     Status: None (Preliminary result)   Collection Time: 01/08/24  4:25 AM   Specimen: BLOOD LEFT HAND  Result Value Ref Range Status   Specimen Description   Final    BLOOD LEFT HAND Performed at [facility], [address] [place], [place] [address]    Special Requests   Final    BOTTLES DRAWN AEROBIC AND ANAEROBIC Blood Culture results may not be optimal due to an inadequate volume of blood received in culture bottles Performed at [facility], [address] [place], [place] [address]    Culture   Final    NO GROWTH 1 DAY Performed at [facility], [address], [place], [place] [address]    Report Status PENDING  Incomplete         Radiology Studies: EP STUDY Result Date: 01/10/2024 See surgical note for result.       Scheduled Meds:  enoxaparin  (LOVENOX ) injection  40 mg Subcutaneous Q24H   metoprolol  succinate  50 mg Oral Daily   Continuous Infusions:  ampicillin  (OMNIPEN) IV Stopped (01/10/24 1405)   cefTRIAXone  (ROCEPHIN )  IV Stopped (01/10/24 1452)     LOS: 3 days    Time spent: over 30 min    Donnetta Gains, MD [facility]   To contact the attending provider between 7A-7P or the covering provider during after hours 7P-7A, please log into the web site [email or web address] and access using universal [place] password for that web site. If you do not have the password, please call the hospital operator.  01/10/2024, 4:49 PM

## 2024-01-11 ENCOUNTER — Other Ambulatory Visit: Payer: Self-pay

## 2024-01-11 DIAGNOSIS — R7881 Bacteremia: Secondary | ICD-10-CM | POA: Diagnosis not present

## 2024-01-11 DIAGNOSIS — I358 Other nonrheumatic aortic valve disorders: Secondary | ICD-10-CM

## 2024-01-11 DIAGNOSIS — B9689 Other specified bacterial agents as the cause of diseases classified elsewhere: Secondary | ICD-10-CM | POA: Diagnosis not present

## 2024-01-11 DIAGNOSIS — R7401 Elevation of levels of liver transaminase levels: Secondary | ICD-10-CM | POA: Diagnosis not present

## 2024-01-11 LAB — COMPREHENSIVE METABOLIC PANEL
ALT: 51 U/L — ABNORMAL HIGH (ref 0–44)
AST: 27 U/L (ref 15–41)
Albumin: 3 g/dL — ABNORMAL LOW (ref 3.5–5.0)
Alkaline Phosphatase: 55 U/L (ref 38–126)
Anion gap: 11 (ref 5–15)
BUN: 17 mg/dL (ref 6–20)
CO2: 22 mmol/L (ref 22–32)
Calcium: 8.8 mg/dL — ABNORMAL LOW (ref 8.9–10.3)
Chloride: 104 mmol/L (ref 98–111)
Creatinine, Ser: 0.96 mg/dL (ref 0.61–1.24)
GFR, Estimated: >60 mL/min (ref >60)
Glucose, Bld: 103 mg/dL — ABNORMAL HIGH (ref 70–99)
Potassium: 4 mmol/L (ref 3.5–5.1)
Sodium: 137 mmol/L (ref 135–145)
Total Bilirubin: 0.6 mg/dL (ref 0.0–1.2)
Total Protein: 6.6 g/dL (ref 6.5–8.1)

## 2024-01-11 LAB — CBC WITH DIFFERENTIAL/PLATELET
Abs Immature Granulocytes: 0.01 10*3/uL (ref 0.00–0.07)
Basophils Absolute: 0 10*3/uL (ref 0.0–0.1)
Basophils Relative: 1 %
Eosinophils Absolute: 0.1 10*3/uL (ref 0.0–0.5)
Eosinophils Relative: 2 %
HCT: 34.9 % — ABNORMAL LOW (ref 39.0–52.0)
Hemoglobin: 11.6 g/dL — ABNORMAL LOW (ref 13.0–17.0)
Immature Granulocytes: 0 %
Lymphocytes Relative: 31 %
Lymphs Abs: 1.5 10*3/uL (ref 0.7–4.0)
MCH: 29.2 pg (ref 26.0–34.0)
MCHC: 33.2 g/dL (ref 30.0–36.0)
MCV: 87.9 fL (ref 80.0–100.0)
Monocytes Absolute: 0.6 10*3/uL (ref 0.1–1.0)
Monocytes Relative: 11 %
Neutro Abs: 2.8 10*3/uL (ref 1.7–7.7)
Neutrophils Relative %: 55 %
Platelets: 222 10*3/uL (ref 150–400)
RBC: 3.97 MIL/uL — ABNORMAL LOW (ref 4.22–5.81)
RDW: 12.9 % (ref 11.5–15.5)
WBC: 5 10*3/uL (ref 4.0–10.5)
nRBC: 0 % (ref 0.0–0.2)

## 2024-01-11 LAB — PHOSPHORUS: Phosphorus: 4 mg/dL (ref 2.5–4.6)

## 2024-01-11 LAB — MAGNESIUM: Magnesium: 2.4 mg/dL (ref 1.7–2.4)

## 2024-01-11 MED ORDER — CHLORHEXIDINE GLUCONATE CLOTH 2 % EX PADS
6.0000 | MEDICATED_PAD | Freq: Every day | CUTANEOUS | Status: DC
  Administered 2024-01-11 – 2024-01-12 (×2): 6 via TOPICAL

## 2024-01-11 MED ORDER — SODIUM CHLORIDE 0.9% FLUSH: 10.0000 mL | INTRAVENOUS | Status: DC | PRN

## 2024-01-11 NOTE — Progress Notes (Addendum)
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 Possible hx of paronychia. No GI symptoms piror to hospitalization. - Blood cultures are CIBA grew E faecalis.  Patient on vancomycin. - TTE showed concern for mobile mass on native aortic valve, TEE showed  -MRI brain 9 showed signal abnormality tiny acute to subacute ischemic infarct in high left frontal temporal lobe.  I agree this is more consistent with infarct rather than emboli -TEE showed small echodensity on the noncoronary cusp of aortic valve measuring 0.5 cm concerning vegetation, moderate AI #Ampicillin allergy #Back Rash - Patient states ampicillin allergy was enforced.  Developed rash but no shortness of breath tolerated ampicillin -Also there is some concern of a rash that developed after vancomycin on his back which is pruritic following first dose of antibiotics to admission. Does not seem  typical of drug rash. Improved today   Recommendations: -Continue ceftriaxone and ampicllin x 6 weeks from negative cultures for bacteremia with IE.  - Follow repeat blood cultures  to ensure clearance - Communicated with CTS via epic chat  per aortic valve veg and aortic aneurysm. No plans for OR, can repeat echo at end abx treatment. -F/U with cardiology for repeat echo at end of abx treatment.  - Patient is hepatitis A and  hepatitis B nonimmune, okay to vaccinate -ID will sign off.  #Antibiotics associated diarrhea -1 watery stool a day since starting abx. No abdominal pain -Recommend probiotics.   Evaluation of this patient requires complex antimicrobial therapy evaluation and counseling + isolation needs for disease transmission risk assessment and mitigation    OPAT ORDERS:  Diagnosis: E faecalis endocarditis  Allergies  Allergen Reactions   Atorvastatin     Other reaction(s): joint aches   Simvastatin Other (See Comments)    Joint aches   Honey Rash     Discharge antibiotics to be given via PICC line:  Per pharmacy protocol  Ampicillin 12 gm every 24 hours as a continuous infusion + Ceftriaxone 2 gm IV q 12 hours    Duration: 6 weeks End Date: 3/22  [facility] Per Protocol with [employer] Use: Home health RN for IV administration and teaching, line care and labs.    Labs weekly while on IV antibiotics: _x_ CBC with differential __ BMP **TWICE WEEKLY ON VANCOMYCIN  _x_ CMP _x_ CRP _x_ ESR __ Vancomycin trough TWICE WEEKLY __ CK  __ Please pull PIC at completion of IV antibiotics x__ Please leave PIC in place until doctor has seen patient or been notified  Fax weekly labs to [phone]  Clinic Follow Up Appt: 3/3  @ [facility] with Dr. Daiva Eves  Microbiology:   Antibiotics: Vancomycin Cultures: Blood 2/62/2E faecalis 2/72/2E faecalis 2/8 pending Urine  Other   SUBJECTIVE: Sitting in bed.  No new complaints. Interval: Afebrile overnight.  Review of Systems: Review of Systems  All other  systems reviewed and are negative.    Scheduled Meds:  enoxaparin (LOVENOX) injection  40 mg Subcutaneous Q24H   metoprolol succinate  50 mg Oral Daily   Continuous Infusions:  ampicillin (OMNIPEN) IV 2 g (01/11/24 0615)   cefTRIAXone (ROCEPHIN)  IV 2 g (01/11/24 1030)   PRN Meds:.acetaminophen **OR** acetaminophen, diphenhydrAMINE, ondansetron **OR** ondansetron (ZOFRAN)  IV Allergies  Allergen Reactions   Atorvastatin     Other reaction(s): joint aches   Simvastatin Other (See Comments)    Joint aches   Honey Rash    OBJECTIVE: Vitals:   01/10/24 1341 01/10/24 1949 01/11/24 0334 01/11/24 0957  BP: 127/73 124/73 96/64 110/67  Pulse: 75 84 78 81  Resp: 20 17 16    Temp: 98.7 F (37.1 C) 99.8 F (37.7 C) 99.2 F (37.3 C)   TempSrc: Oral Oral Oral   SpO2: 99% 98% 98%   Weight:      Height:       Body mass index is 29.02 kg/m.  Physical Exam Constitutional:      General: He is not in acute distress.    Appearance: He is normal weight. He is not toxic-appearing.  HENT:     Head: Normocephalic and atraumatic.     Right Ear: External ear normal.     Left Ear: External ear normal.     Nose: No congestion or rhinorrhea.     Mouth/Throat:     Mouth: Mucous membranes are moist.     Pharynx: Oropharynx is clear.  Eyes:     Extraocular Movements: Extraocular movements intact.     Conjunctiva/sclera: Conjunctivae normal.     Pupils: Pupils are equal, round, and reactive to light.  Cardiovascular:     Rate and Rhythm: Normal rate and regular rhythm.     Heart sounds: No murmur heard.    No friction rub. No gallop.  Pulmonary:     Effort: Pulmonary effort is normal.     Breath sounds: Normal breath sounds.  Abdominal:     General: Abdomen is flat. Bowel sounds are normal.     Palpations: Abdomen is soft.  Musculoskeletal:        General: No swelling. Normal range of motion.     Cervical back: Normal range of motion and neck supple.  Skin:    General: Skin is warm and dry.  Neurological:     General: No focal deficit present.     Mental Status: He is oriented to person, place, and time.  Psychiatric:        Mood and Affect: Mood normal.   Back rash    Lab Results Lab Results  Component Value Date   WBC 5.0 01/11/2024   HGB 11.6 (L) 01/11/2024   HCT 34.9 (L) 01/11/2024   MCV 87.9 01/11/2024   PLT 222 01/11/2024    Lab Results   Component Value Date   CREATININE 0.96 01/11/2024   BUN 17 01/11/2024   NA 137 01/11/2024   K 4.0 01/11/2024   CL 104 01/11/2024   CO2 22 01/11/2024    Lab Results  Component Value Date   ALT 51 (H) 01/11/2024   AST 27 01/11/2024   ALKPHOS 55 01/11/2024   BILITOT 0.6 01/11/2024        Danelle Earthly, MD [facility] 01/11/2024, 10:33 AM  I have personally spent 56 minutes involved in face-to-face and non-face-to-face activities for this patient on the day of the visit. Professional  time spent includes the following activities: Preparing to see the patient (review of tests), Obtaining and/or reviewing separately obtained history (admission/discharge record), Performing a medically appropriate examination and/or evaluation , Ordering medications/tests/procedures, referring and communicating with other health care professionals, Documenting clinical information in the EMR, Independently interpreting results (not separately reported), Communicating results to the patient/family/caregiver, Counseling and educating the patient/family/caregiver and Care coordination (not separately reported).   Evaluation of this patient requires complex antimicrobial therapy evaluation and counseling + isolation needs for disease transmission risk assessment and mitigation

## 2024-01-11 NOTE — TOC Progression Note (Signed)
Transition of Care [facility]) - Progression Note   Patient Details  Name: RHIAN FUNARI MRN: [id number] Date of Birth: 05-18-1961  Transition of Care [facility]) CM/SW Contact  Ewing Schlein, LCSW Phone Number: 01/11/2024, 3:39 PM  Clinical Narrative: Patient will need to go home with IV antibiotics. CSW met with patient to discuss recommendations. Patient agreeable to [facility] providing IV antibiotics and has no HH agency preference. CSW made [facility] referral to Cindie with [facility] which was accepted. Pam with [facility] to provide teaching onsite tomorrow. Patient updated.  Expected Discharge Plan: Home w Home Health Services Barriers to Discharge: Continued Medical Work up  Expected Discharge Plan and Services In-house Referral: Clinical Social Work Post Acute Care Choice: Home Health Living arrangements for the past 2 months: Single Family Home          DME Arranged: N/A DME Agency: NA HH Arranged: RN, IV Antibiotics HH Agency: [facility], [facility] Date [facility] Agency Contacted: 01/11/24 Representative spoke with at [facility] Agency: Marchelle Folks)  Social Determinants of Health (SDOH) Interventions SDOH Screenings   Food Insecurity: No Food Insecurity (01/07/2024)  Housing: Low Risk  (01/07/2024)  Transportation Needs: No Transportation Needs (01/07/2024)  Utilities: Not At Risk (01/07/2024)  Depression (PHQ2-9): Low Risk  (01/25/2019)  Tobacco Use: Low Risk  (01/07/2024)   Readmission Risk Interventions     No data to display

## 2024-01-11 NOTE — Progress Notes (Addendum)
PROGRESS NOTE    Robert Madden  ZOX:[id number] DOB: 04-Jan-1961 DOA: 01/07/2024 PCP: Clovis Riley, L.[name], MD (Inactive)  Chief Complaint  Patient presents with   Fever    Brief Narrative:    Robert Madden is Robert Madden 63 y.o. male with medical history significant of aortic insufficiency, aortic root dilation, thoracic aortic aneurysm, hyperlipidemia, IBS, paroxysmal tachycardia who has been having fever associated with malaise for the past 3 weeks and has been referred by ID to the emergency department for admission due to blood cultures growing gram-positive bacteria.   Assessment & Plan:   Principal Problem:   Bacteremia Active Problems:   Aortic aneurysm, thoracic (HCC)   SVT (supraventricular tachycardia) (HCC)   Hypercholesterolemia without hypertriglyceridemia   Abnormal LFTs   Hyponatremia   Hypocarbia  Enterococcus Faecalis Bacteremia Aortic Valve Endocarditis Blood culture 2/7 with enterococcus faecalis on BCID -> amp sensitive  Repeat 2/8 cultures NGx1  Echo with small mobile echodensity on noncoronary cusp of aortic valve - can't excluded small aortic valve vegetation TEE Monday 2/10 with small echodensity on noncoronary cusp of aortic valve measuring 0.5 cm, concerning for vegetation MRI brain without evidence for brain abscess or CNS infection Appreciate ID assistance Getting CT surgery input with endocarditis/aneurysm I sent message to cardmaster for follow up appointment for repeat echo after abx complete  MRI brain with single punctate focus of diffusion signal abnormality involving the high left frontal lobe, concerning for tiny acute to subacute ischemic infarct Will discuss with neurology -> see "plan of care" note from 2/8, finding thought to represent artifact  Elevated LFT's  Mild RUQ [place] without cholelithiasis or cholecystitis  Negative hep Robert Madden Ab Hb B surface Ab pending  Aortic Aneurysm Outpatient follow up  SVT Metoprolol    DVT prophylaxis:  lovenox Code Status: full Family Communication: wife at bedside Disposition:   Status is: Inpatient Remains inpatient appropriate because: need for continued inpatient care   Consultants:  ID  Procedures:  Echo IMPRESSIONS     1. Left ventricular ejection fraction, by estimation, is 65 to 70%. The  left ventricle has hyperdynamic function. The left ventricle has no  regional wall motion abnormalities. There is mild asymmetric left  ventricular hypertrophy of the basal-septal  segment. Left ventricular diastolic parameters were normal.   2. Right ventricular systolic function is normal. The right ventricular  size is normal.   3. The mitral valve is normal in structure. Mild mitral valve  regurgitation. No evidence of mitral stenosis.   4. Small mobile echodensity seen on the noncoronary cusp of the aortic  valve, best seen in the parasternal long axis view. The aortic valve is  normal in structure. Aortic valve regurgitation is mild. No aortic  stenosis is present.   5. Aortic dilatation noted. There is moderate dilatation of the aortic  root, measuring 43 mm. There is moderate dilatation of the ascending  aorta, measuring 44 mm.   6. The inferior vena cava is normal in size with greater than 50%  respiratory variability, suggesting right atrial pressure of 3 mmHg.   Conclusion(s)/Recommendation(s): Cannot exclude small aortic valve  vegetation. Consider TEE if clinically indicated.   Antimicrobials:  Anti-infectives (From admission, onward)    Start     Dose/Rate Route Frequency Ordered Stop   01/10/24 1130  ampicillin (OMNIPEN) 2 g in sodium chloride 0.9 % 100 mL IVPB        2 g 300 mL/hr over 20 Minutes Intravenous Every 4 hours 01/10/24 1033  01/10/24 1130  cefTRIAXone (ROCEPHIN) 2 g in sodium chloride 0.9 % 100 mL IVPB        2 g 200 mL/hr over 30 Minutes Intravenous Every 12 hours 01/10/24 1033     01/08/24 0100  vancomycin (VANCOCIN) IVPB 1000 mg/200 mL  premix  Status:  Discontinued        1,000 mg 200 mL/hr over 60 Minutes Intravenous Every 12 hours 01/07/24 1159 01/07/24 1515   01/07/24 2300  DAPTOmycin (CUBICIN) 800 mg in sodium chloride 0.9 % IVPB  Status:  Discontinued        8 mg/kg  97.1 kg 132 mL/hr over 30 Minutes Intravenous Daily 01/07/24 1515 01/07/24 1522   01/07/24 2300  DAPTOmycin (CUBICIN) IVPB 700 mg/129mL premix  Status:  Discontinued        8 mg/kg  85.4 kg (Adjusted) 200 mL/hr over 30 Minutes Intravenous Daily 01/07/24 1522 01/07/24 1525   01/07/24 2300  vancomycin (VANCOREADY) IVPB 1250 mg/250 mL  Status:  Discontinued        1,250 mg 166.7 mL/hr over 90 Minutes Intravenous Every 12 hours 01/07/24 1528 01/10/24 1033   01/07/24 1145  vancomycin (VANCOREADY) IVPB 2000 mg/400 mL        2,000 mg 200 mL/hr over 120 Minutes Intravenous  Once 01/07/24 1134 01/07/24 1409       Subjective: No new complaints  Objective: Vitals:   01/10/24 1341 01/10/24 1949 01/11/24 0334 01/11/24 0957  BP: 127/73 124/73 96/64 110/67  Pulse: 75 84 78 81  Resp: 20 17 16    Temp: 98.7 F (37.1 C) 99.8 F (37.7 C) 99.2 F (37.3 C)   TempSrc: Oral Oral Oral   SpO2: 99% 98% 98%   Weight:      Height:        Intake/Output Summary (Last 24 hours) at 01/11/2024 1109 Last data filed at 01/10/2024 1459 Gross per 24 hour  Intake 300 ml  Output --  Net 300 ml    Filed Weights   01/07/24 1019  Weight: 97.1 kg    Examination:  General: No acute distress. Cardiovascular: Heart sounds show Suda Forbess regular rate, and rhythm. No gallops or rubs.  Lungs: Clear to auscultation bilaterally anteriorly Abdomen: Soft, nontender, nondistended Neurological: Alert and oriented 3. Moves all extremities 4 with equal strength. Cranial nerves II through XII grossly intact.. Extremities: No clubbing or cyanosis. No edema.  Data Reviewed: I have personally reviewed following labs and imaging studies  CBC: Recent Labs  Lab 01/07/24 1053  01/08/24 0425 01/09/24 0412 01/11/24 0351  WBC 7.8 7.1 6.0 5.0  NEUTROABS 5.5  --  3.6 2.8  HGB 13.5 12.3* 11.9* 11.6*  HCT 40.2 36.9* 36.5* 34.9*  MCV 86.8 88.5 88.6 87.9  PLT 298 258 236 222    Basic Metabolic Panel: Recent Labs  Lab 01/07/24 1053 01/08/24 0425 01/09/24 0412 01/11/24 0351  NA 133* 134* 137 137  K 4.2 4.0 4.3 4.0  CL 101 104 103 104  CO2 21* 21* 25 22  GLUCOSE 96 116* 107* 103*  BUN 16 17 16 17   CREATININE 1.17 1.03 1.02 0.96  CALCIUM 9.0 8.7* 8.7* 8.8*  MG  --   --  2.4 2.4  PHOS  --   --  4.1 4.0    GFR: Estimated Creatinine Clearance: 98.8 mL/min (by C-G formula based on SCr of 0.96 mg/dL).  Liver Function Tests: Recent Labs  Lab 01/07/24 1053 01/07/24 2136 01/08/24 0425 01/09/24 0412 01/11/24 0351  AST 33  33 29 25 27   ALT 63* 61* 58* 50* 51*  ALKPHOS 70 68 64 60 55  BILITOT 1.2 0.8 0.9 0.7 0.6  PROT 7.7 7.5 7.4 7.0 6.6  ALBUMIN 3.6 3.4* 3.5 3.2* 3.0*    CBG: No results for input(s): "GLUCAP" in the last 168 hours.   Recent Results (from the past 240 hours)  Culture, blood (single) w Reflex to ID Panel     Status: Abnormal   Collection Time: 01/06/24 10:43 AM   Specimen: Blood  Result Value Ref Range Status   MICRO NUMBER: [id number]  Final   SPECIMEN QUALITY: Suboptimal  Final   Source BLOOD 1  Final   STATUS: FINAL  Final   Result:   Final    Inspection of blood culture bottles indicates that an inadequate volume of blood may have been collected for the detection of sepsis.   ISOLATE 1: Enterococcus faecalis (AA)  Final    Comment: Enterococcus faecalis , from aerobic and anaerobic bottles   COMMENT: Aerobic and anaerobic bottle received.  Final      Susceptibility   Enterococcus faecalis - BLOOD CULTURE, POSITIVE 1    AMPICILLIN <=2 Sensitive     VANCOMYCIN 2 Sensitive     GENTAMICIN SYNERGY*  Sensitive      * This organism does not exhibit high level resistance to gentamicin which indicates that synergy of gentamicin  with Robert Madden penicillin is likely.     STREPTOMYCIN*  Sensitive      * This organism does not exhibit high level resistance to gentamicin which indicates that synergy of gentamicin with Robert Madden penicillin is likely. This organism does not exhibit high level resistance to streptomycin which indicates that synergy of streptomycin with Anavictoria Madden penicillin is likely. Legend: S = Susceptible  I = Intermediate R = Resistant  NS = Not susceptible SDD = Susceptible Dose Dependent * = Not Tested  NR = Not Reported **NN = See Therapy Comments   Culture, blood (single) w Reflex to ID Panel     Status: Abnormal   Collection Time: 01/06/24 10:46 AM   Specimen: Blood  Result Value Ref Range Status   MICRO NUMBER: [id number]  Final   SPECIMEN QUALITY: Adequate  Final   Source BLOOD 2  Final   STATUS: FINAL  Final   ISOLATE 1: Enterococcus faecalis (AA)  Final    Comment: Enterococcus faecalis , from aerobic and anaerobic bottles   COMMENT: Aerobic and anaerobic bottle received.  Final      Susceptibility   Enterococcus faecalis - BLOOD CULTURE, POSITIVE 1    AMPICILLIN <=2 Sensitive     VANCOMYCIN 2 Sensitive     GENTAMICIN SYNERGY*  Sensitive      * This organism does not exhibit high level resistance to gentamicin which indicates that synergy of gentamicin with Robert Madden penicillin is likely.     STREPTOMYCIN*  Sensitive      * This organism does not exhibit high level resistance to gentamicin which indicates that synergy of gentamicin with Robert Madden penicillin is likely. This organism does not exhibit high level resistance to streptomycin which indicates that synergy of streptomycin with Robert Madden penicillin is likely. Legend: S = Susceptible  I = Intermediate R = Resistant  NS = Not susceptible SDD = Susceptible Dose Dependent * = Not Tested  NR = Not Reported **NN = See Therapy Comments   Blood culture (routine x 2)     Status: Abnormal   Collection Time: 01/07/24 10:51 AM  Specimen: BLOOD  Result Value Ref Range  Status   Specimen Description   Final    BLOOD LEFT ANTECUBITAL Performed at [facility], [address], [place], [place] [address]    Special Requests   Final    BOTTLES DRAWN AEROBIC AND ANAEROBIC Blood Culture adequate volume Performed at [facility], [address] [place], [place] [address]    Culture  Setup Time   Final    GRAM POSITIVE COCCI IN BOTH AEROBIC AND ANAEROBIC BOTTLES CRITICAL RESULT CALLED TO, READ BACK BY AND VERIFIED WITH: PHARMD C SHADE 2/0/2025 @ 0725 BY AB Performed at [facility], [address] [place], [place] [address]    Culture ENTEROCOCCUS FAECALIS (Robert Madden)  Final   Report Status 01/10/2024 FINAL  Final   Organism ID, Bacteria ENTEROCOCCUS FAECALIS  Final      Susceptibility   Enterococcus faecalis - MIC*    AMPICILLIN <=2 SENSITIVE Sensitive     VANCOMYCIN 2 SENSITIVE Sensitive     GENTAMICIN SYNERGY SENSITIVE Sensitive     * ENTEROCOCCUS FAECALIS  Blood Culture ID Panel (Reflexed)     Status: Abnormal   Collection Time: 01/07/24 10:51 AM  Result Value Ref Range Status   Enterococcus faecalis DETECTED (Robert Madden) NOT DETECTED Final    Comment: CRITICAL RESULT CALLED TO, READ BACK BY AND VERIFIED WITH: PHARMD C SHADE 2/0/2025 @ 0725 BY AB    Enterococcus Faecium NOT DETECTED NOT DETECTED Final   Listeria monocytogenes NOT DETECTED NOT DETECTED Final   Staphylococcus species NOT DETECTED NOT DETECTED Final   Staphylococcus aureus (BCID) NOT DETECTED NOT DETECTED Final   Staphylococcus epidermidis NOT DETECTED NOT DETECTED Final   Staphylococcus lugdunensis NOT DETECTED NOT DETECTED Final   Streptococcus species NOT DETECTED NOT DETECTED Final   Streptococcus agalactiae NOT DETECTED NOT DETECTED Final   Streptococcus pneumoniae NOT DETECTED NOT DETECTED Final   Streptococcus pyogenes NOT DETECTED NOT DETECTED Final   Allex [place].calcoaceticus-baumannii NOT DETECTED NOT DETECTED Final   Bacteroides fragilis NOT DETECTED  NOT DETECTED Final   Enterobacterales NOT DETECTED NOT DETECTED Final   Enterobacter cloacae complex NOT DETECTED NOT DETECTED Final   Escherichia coli NOT DETECTED NOT DETECTED Final   Klebsiella aerogenes NOT DETECTED NOT DETECTED Final   Klebsiella oxytoca NOT DETECTED NOT DETECTED Final   Klebsiella pneumoniae NOT DETECTED NOT DETECTED Final   Proteus species NOT DETECTED NOT DETECTED Final   Salmonella species NOT DETECTED NOT DETECTED Final   Serratia marcescens NOT DETECTED NOT DETECTED Final   Haemophilus influenzae NOT DETECTED NOT DETECTED Final   Neisseria meningitidis NOT DETECTED NOT DETECTED Final   Pseudomonas aeruginosa NOT DETECTED NOT DETECTED Final   Stenotrophomonas maltophilia NOT DETECTED NOT DETECTED Final   Candida albicans NOT DETECTED NOT DETECTED Final   Candida auris NOT DETECTED NOT DETECTED Final   Candida glabrata NOT DETECTED NOT DETECTED Final   Candida krusei NOT DETECTED NOT DETECTED Final   Candida parapsilosis NOT DETECTED NOT DETECTED Final   Candida tropicalis NOT DETECTED NOT DETECTED Final   Cryptococcus neoformans/gattii NOT DETECTED NOT DETECTED Final   Vancomycin resistance NOT DETECTED NOT DETECTED Final    Comment: Performed at [facility], [address], [place], [place] [address]  Blood culture (routine x 2)     Status: Abnormal   Collection Time: 01/07/24 11:25 AM   Specimen: BLOOD  Result Value Ref Range Status   Specimen Description   Final  BLOOD LEFT ANTECUBITAL Performed at [facility], [address], [place], [place] [address]    Special Requests   Final    BOTTLES DRAWN AEROBIC AND ANAEROBIC Blood Culture adequate volume Performed at [facility], [address], [place], [place] [address]    Culture  Setup Time   Final    GRAM POSITIVE COCCI IN BOTH AEROBIC AND ANAEROBIC BOTTLES CRITICAL VALUE NOTED.  VALUE IS CONSISTENT WITH PREVIOUSLY REPORTED AND CALLED VALUE.     Culture (Robert Madden)  Final    ENTEROCOCCUS FAECALIS SUSCEPTIBILITIES PERFORMED ON PREVIOUS CULTURE WITHIN THE LAST 5 DAYS. Performed at [facility], [address] [place], [place] [address]    Report Status 01/10/2024 FINAL  Final  Culture, blood (Routine X 2) w Reflex to ID Panel     Status: None (Preliminary result)   Collection Time: 01/08/24  4:14 AM   Specimen: BLOOD RIGHT ARM  Result Value Ref Range Status   Specimen Description   Final    BLOOD RIGHT ARM Performed at [facility], [address], [place], [place] [address]    Special Requests   Final    BOTTLES DRAWN AEROBIC ONLY Blood Culture results may not be optimal due to an inadequate volume of blood received in culture bottles Performed at [facility], [address], [place], [place] [address]    Culture   Final    NO GROWTH 3 DAYS Performed at [facility], [address], [place], [place] [address]    Report Status PENDING  Incomplete  Culture, blood (Routine X 2) w Reflex to ID Panel     Status: None (Preliminary result)   Collection Time: 01/08/24  4:25 AM   Specimen: BLOOD LEFT HAND  Result Value Ref Range Status   Specimen Description   Final    BLOOD LEFT HAND Performed at [facility], [address] [place], [place] [address]    Special Requests   Final    BOTTLES DRAWN AEROBIC AND ANAEROBIC Blood Culture results may not be optimal due to an inadequate volume of blood received in culture bottles Performed at [facility], [address] [place], [place] [address]    Culture   Final    NO GROWTH 3 DAYS Performed at [facility], [address], [place], [place] [address]    Report Status PENDING  Incomplete         Radiology Studies: [place] EKG SITE RITE Result Date: 01/11/2024 If Site Rite image not attached, placement could not be confirmed due to current cardiac rhythm.  ECHO TEE Result Date: 01/10/2024    TRANSESOPHOGEAL ECHO  REPORT   Patient Name:   YANNIS BROCE Date of Exam: 01/10/2024 Medical Rec #:  [id number]         Height:       72.0 in Accession #:    [id number]        Weight:       214.0 lb Date of Birth:  [birth year]         BSA:          2.193 m Patient Age:    62 years          BP:           125/79 mmHg Patient Gender: M                 HR:  78 bpm. Exam Location:  Inpatient Procedure: 3D Echo, Transesophageal Echo, Cardiac Doppler and Color Doppler Indications:    Endocarditis  History:        Patient has prior history of Echocardiogram examinations, most                 recent 01/07/2024.  Sonographer:    Harriette Bouillon RDCS Referring Phys: [id number] Robert Madden PROCEDURE: The transesophogeal probe was passed without difficulty through the esophogus of the patient. Sedation performed by different physician. The patient developed no complications during the procedure.  IMPRESSIONS  1. Small echodensity on noncoronary cusp of aortic valve measuring 0.5cm, concerning for vegetation. Moderate AI  2. Left ventricular ejection fraction, by estimation, is 60 to 65%. The left ventricle has normal function.  3. Right ventricular systolic function is normal. The right ventricular size is normal.  4. No left atrial/left atrial appendage thrombus was detected.  5. The mitral valve is normal in structure. Mild mitral valve regurgitation.  6. The aortic valve is tricuspid. Aortic valve regurgitation is moderate. No aortic stenosis is present.  7. Aortic dilatation noted. 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 FINDINGS  Left Ventricle: Left ventricular ejection fraction, by estimation, is 60 to 65%. The left ventricle has normal function. The left ventricular internal cavity size was normal in size. Right Ventricle: The right ventricular size is normal. No increase in right ventricular wall thickness. Right ventricular systolic function is normal. Left  Atrium: Left atrial size was normal in size. No left atrial/left atrial appendage thrombus was detected. Right Atrium: Right atrial size was normal in size. Pericardium: There is no evidence of pericardial effusion. Mitral Valve: The mitral valve is normal in structure. Mild mitral valve regurgitation. Tricuspid Valve: The tricuspid valve is normal in structure. Tricuspid valve regurgitation is trivial. Aortic Valve: The aortic valve is tricuspid. Aortic valve regurgitation is moderate. Aortic regurgitation PHT measures 346 msec. No aortic stenosis is present. Pulmonic Valve: The pulmonic valve was not well visualized. Pulmonic valve regurgitation is trivial. Aorta: Aortic dilatation noted. There is dilatation of the ascending aorta, measuring 42 mm. There is an aneurysm involving the aortic root measuring 46 mm. IAS/Shunts: No atrial level shunt detected by color flow Doppler.  AORTIC VALVE AI PHT:      346 msec  AORTA Ao Root diam: 4.60 cm Ao Asc diam:  4.20 cm Epifanio Lesches MD Electronically signed by Epifanio Lesches MD Signature Date/Time: 01/10/2024/5:20:59 PM    Final    EP STUDY Result Date: 01/10/2024 See surgical note for result.       Scheduled Meds:  enoxaparin (LOVENOX) injection  40 mg Subcutaneous Q24H   metoprolol succinate  50 mg Oral Daily   Continuous Infusions:  ampicillin (OMNIPEN) IV 2 g (01/11/24 0615)   cefTRIAXone (ROCEPHIN)  IV 2 g (01/11/24 1030)     LOS: 4 days    Time spent: over 30 min    Lacretia Nicks, MD [facility]   To contact the attending provider between 7A-7P or the covering provider during after hours 7P-7A, please log into the web site [email or web address] and access using universal Fate password for that web site. If you do not have the password, please call the hospital operator.  01/11/2024, 11:09 AM

## 2024-01-11 NOTE — Progress Notes (Signed)
Peripherally Inserted Central Catheter Placement  The IV Nurse has discussed with the patient and/or persons authorized to consent for the patient, the purpose of this procedure and the potential benefits and risks involved with this procedure.  The benefits include less needle sticks, lab draws from the catheter, and the patient may be discharged home with the catheter. Risks include, but not limited to, infection, bleeding, blood clot (thrombus formation), and puncture of an artery; nerve damage and irregular heartbeat and possibility to perform a PICC exchange if needed/ordered by physician.  Alternatives to this procedure were also discussed.  Bard Power PICC patient education guide, fact sheet on infection prevention and patient information card has been provided to patient /or left at bedside.    PICC Placement Documentation  PICC Double Lumen 01/11/24 Right Basilic 40 cm 0 cm (Active)  Indication for Insertion or Continuance of Line Home intravenous therapies (PICC only) 01/11/24 1744  Exposed Catheter (cm) 0 cm 01/11/24 1744  Site Assessment Clean, Dry, Intact 01/11/24 1744  Lumen #1 Status Flushed;Saline locked;Blood return noted 01/11/24 1744  Lumen #2 Status Flushed;Saline locked;Blood return noted 01/11/24 1744  Dressing Type Transparent;Securing device 01/11/24 1744  Dressing Status Antimicrobial disc/dressing in place;Clean, Dry, Intact 01/11/24 1744  Line Care Connections checked and tightened 01/11/24 1744  Line Adjustment (NICU/IV Team Only) No 01/11/24 1744  Dressing Intervention New dressing;Adhesive placed at insertion site (IV team only) 01/11/24 1744  Dressing Change Due 01/18/24 01/11/24 1744       Robert Madden 01/11/2024, 5:45 PM

## 2024-01-11 NOTE — Progress Notes (Signed)
Pharmacy Note- Penicillin Allergy Clarification   ASSESSMENT:   PEN-FAST Scoring   Five years or less since last reaction 0  Anaphylaxis/Angioedema OR Severe cutaneous adverse reaction  0  Treatment required for reaction  0  Total Score 0 points - Very low risk of positive penicillin allergy test (<1%)      Type of intervention (select all that apply):  Direct IV therapy  Impact on therapy (select all that apply):  Penicillin allergy removed and Antibiotic de-escalation   POST-CHALLENGE FOLLOW-UP: Patient tolerated  IV ampicillin on 01/10/24 with no adverse reaction> Allergy removed.    Sharin Mons, PharmD, BCPS, BCIDP Infectious Diseases Clinical Pharmacist Phone: [phone] 01/11/2024 10:29 AM

## 2024-01-11 NOTE — Anesthesia Postprocedure Evaluation (Signed)
Anesthesia Post Note  Patient: Robert Madden  Procedure(s) Performed: TRANSESOPHAGEAL ECHOCARDIOGRAM     Patient location during evaluation: PACU Anesthesia Type: MAC Level of consciousness: awake and alert Pain management: pain level controlled Vital Signs Assessment: post-procedure vital signs reviewed and stable Respiratory status: spontaneous breathing, nonlabored ventilation, respiratory function stable and patient connected to nasal cannula oxygen Cardiovascular status: stable and blood pressure returned to baseline Postop Assessment: no apparent nausea or vomiting Anesthetic complications: no   No notable events documented.  Last Vitals:  Vitals:   01/11/24 0334 01/11/24 0957  BP: 96/64 110/67  Pulse: 78 81  Resp: 16   Temp: 37.3 C   SpO2: 98%     Last Pain:  Vitals:   01/11/24 0952  TempSrc:   PainSc: 0-No pain                 Kennieth Rad

## 2024-01-11 NOTE — Progress Notes (Signed)
PHARMACY CONSULT NOTE FOR:  OUTPATIENT  PARENTERAL ANTIBIOTIC THERAPY (OPAT)  Indication: Enterococcal endocarditis  Regimen: Ampicillin 12 gm every 24 hours as a continuous infusion + Ceftriaxone 2 gm IV q 12 hours  End date: 02/19/2024    IV antibiotic discharge orders are pended. To discharging provider:  please sign these orders via discharge navigator,  Select New Orders & click on the button choice - Manage This Unsigned Work.     Thank you for allowing pharmacy to be a part of this patient's care.  Sharin Mons, PharmD, BCPS, BCIDP Infectious Diseases Clinical Pharmacist Phone: [phone] 01/11/2024, 10:24 AM

## 2024-01-12 ENCOUNTER — Telehealth: Payer: Self-pay | Admitting: Cardiology

## 2024-01-12 DIAGNOSIS — I330 Acute and subacute infective endocarditis: Secondary | ICD-10-CM

## 2024-01-12 DIAGNOSIS — I38 Endocarditis, valve unspecified: Secondary | ICD-10-CM

## 2024-01-12 DIAGNOSIS — I7121 Aneurysm of the ascending aorta, without rupture: Secondary | ICD-10-CM | POA: Diagnosis not present

## 2024-01-12 DIAGNOSIS — I358 Other nonrheumatic aortic valve disorders: Secondary | ICD-10-CM

## 2024-01-12 DIAGNOSIS — R7881 Bacteremia: Secondary | ICD-10-CM | POA: Diagnosis not present

## 2024-01-12 MED ORDER — CEFTRIAXONE IV (FOR PTA / DISCHARGE USE ONLY)
2.0000 g | Freq: Two times a day (BID) | INTRAVENOUS | 0 refills | Status: DC
Start: 2024-01-12 — End: 2024-01-19

## 2024-01-12 MED ORDER — AMPICILLIN IV (FOR PTA / DISCHARGE USE ONLY)
12.0000 g | INTRAVENOUS | 0 refills | Status: DC
Start: 2024-01-12 — End: 2024-01-19

## 2024-01-12 NOTE — Discharge Summary (Signed)
Physician Discharge Summary   Robert Madden ZOX:[id number] DOB: 06-Jul-1961 DOA: 01/07/2024  PCP: Clovis Riley, L.[name], MD (Inactive)  Admit date: 01/07/2024 Discharge date: 01/12/2024   Admitted From: Home Disposition:  Home Discharging physician: Lewie Chamber, MD Barriers to discharge: none  Recommendations at discharge: Follow up with ID Follow up with cardiology for repeat echo  Discharge Condition: stable CODE STATUS: Full Diet recommendation:  Diet Orders (From admission, onward)     Start     Ordered   01/12/24 0000  Diet general        01/12/24 1007   01/10/24 1419  Diet regular Room service appropriate? Yes; Fluid consistency: Thin  Diet effective now       Question Answer Comment  Room service appropriate? Yes   Fluid consistency: Thin      01/10/24 1418            Hospital Course:  Principal Problem:   Bacteremia Active Problems:   Aortic aneurysm, thoracic (HCC)   SVT (supraventricular tachycardia) (HCC)   Hypercholesterolemia without hypertriglyceridemia   Abnormal LFTs   Hyponatremia   Hypocarbia   Enterococcus Faecalis Bacteremia Aortic Valve Endocarditis Blood culture 2/7 with enterococcus faecalis on BCID -> amp sensitive  Repeat 2/8 cultures NGx1  Echo with small mobile echodensity on noncoronary cusp of aortic valve - can't excluded small aortic valve vegetation TEE Monday 2/10 with small echodensity on noncoronary cusp of aortic valve measuring 0.5 cm, concerning for vegetation MRI brain without evidence for brain abscess or CNS infection Appreciate ID assistance No role for surgery per CT surgery -Continue on ampicillin and Rocephin, end date 02/19/2024 - follow up with cardiology for repeat echo after abx   MRI brain with single punctate focus of diffusion signal abnormality involving the high left frontal lobe, concerning for 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 On day of discharge, patient was felt deemed stable for discharge. Patient/family member advised to call PCP or come back to ER if needed.   Principal Diagnosis: Bacteremia  Discharge Diagnoses: Active Hospital Problems   Diagnosis Date Noted   Bacteremia 01/07/2024   Abnormal LFTs 01/07/2024   Hyponatremia 01/07/2024   Hypocarbia 01/07/2024   SVT (supraventricular tachycardia) (HCC) 08/30/2015   Hypercholesterolemia without hypertriglyceridemia 02/25/2015   Aortic aneurysm, thoracic (HCC) 12/06/2013    Resolved Hospital Problems  No resolved problems to display.     Discharge Instructions     [facility] pharmacist to adjust dose for Vancomycin, Aminoglycosides and other anti-infective therapies as requested by physician.   Complete by: As directed    [facility] to provide Cath Flo 2mg    Complete by: As directed    Administer for PICC line occlusion and as ordered by physician for other access device issues.   Anaphylaxis Kit: Provided to treat any anaphylactic reaction to the medication being provided to the patient if First Dose or when requested by physician   Complete by: As directed    Epinephrine 1mg /ml vial / amp: Administer 0.3mg  (0.51ml) subcutaneously once for moderate to severe anaphylaxis, nurse to call physician and pharmacy when reaction occurs and call 911 if  needed for immediate care   Diphenhydramine 50mg /ml IV vial: Administer 25-50mg  IV/IM PRN for first dose reaction, rash, itching, mild reaction, nurse to call physician and pharmacy when reaction occurs   Sodium Chloride 0.9% NS IV: Administer if needed for hypovolemic blood pressure drop or as ordered  by physician after call to physician with anaphylactic reaction   Change dressing on IV access line weekly and PRN   Complete by: As directed    Diet general   Complete by: As directed    Flush IV access with Sodium Chloride 0.9% and Heparin 10 units/ml or 100 units/ml   Complete by: As directed    Home infusion instructions - [facility]   Complete by: As directed    Instructions: Flush IV access with Sodium Chloride 0.9% and Heparin 10units/ml or 100units/ml   Change dressing on IV access line: Weekly and PRN   Instructions Cath Flo 2mg : Administer for PICC Line occlusion and as ordered by physician for other access device   [facility] pharmacist to adjust dose for: Vancomycin, Aminoglycosides and other anti-infective therapies as requested by physician   Increase activity slowly   Complete by: As directed    Method of administration may be changed at the discretion of home infusion pharmacist based upon assessment of the patient and/or caregiver's ability to self-administer the medication ordered   Complete by: As directed       Allergies as of 01/12/2024       Reactions   Atorvastatin    Other reaction(s): joint aches   Simvastatin Other (See Comments)   Joint aches   Honey Rash        Medication List     STOP taking these medications    doxycycline 100 MG tablet Commonly known as: VIBRA-TABS   hyoscyamine 0.125 MG Tbdp disintergrating tablet Commonly known as: ANASPAZ       TAKE these medications    ampicillin IVPB Inject 12 g into the vein daily. As a continuous infusion. Indication:  Enterococcal endocarditis  First Dose: Yes Last Day of Therapy:  02/19/24 Labs - Once weekly:  CBC/D and BMP, Labs - Once weekly: ESR and CRP Method of administration: Ambulatory Pump (Continuous Infusion) Method of administration may be changed at the discretion of home infusion pharmacist based upon assessment of the patient and/or caregiver's ability  to self-administer the medication ordered.   cefTRIAXone IVPB Commonly known as: ROCEPHIN Inject 2 g into the vein every 12 (twelve) hours. Indication:  Enterococcal endocarditis  First Dose: Yes Last Day of Therapy:  02/19/24 Labs - Once weekly:  CBC/D and BMP, Labs - Once weekly: ESR and CRP Method of administration: IV Push Method of administration may be changed at the discretion of home infusion pharmacist based upon assessment of the patient and/or caregiver's ability to self-administer the medication ordered.   ibuprofen 200 MG tablet Commonly known as: ADVIL Take 200 mg by mouth every 4 (four) hours as needed for headache or mild pain (pain score 1-3).   metoprolol succinate 50 MG 24 hr tablet Commonly known as: TOPROL-XL Take 1 tablet (50 mg total) by mouth daily.   pravastatin 40 MG tablet Commonly known as: PRAVACHOL takes 40 mg every day               Discharge Care Instructions  (From admission, onward)           Start     Ordered   01/12/24 0000  Change dressing  on IV access line weekly and PRN  (Home infusion instructions - [facility] )        01/12/24 1118            Follow-up Information     [facility] Follow up.   Why: [facility] will provide the home IV antibiotics and nursing for PICC line maintenance and weekly labs.        Sharlene Dory, PA-C Follow up.   Specialty: Cardiology Why: Thursday Mar 02, 2024 Arrive by 2:05 PM Appt at 2:20 PM (25 min) Contact information: [address] [place] [address] [phone]                Allergies  Allergen Reactions   Atorvastatin     Other reaction(s): joint aches   Simvastatin Other (See Comments)    Joint aches   Honey Rash    Consultations: Cardiology ID  Procedures:   Discharge Exam: BP 112/70 (BP Location: Left Arm)   Pulse 79   Temp 98.5 F (36.9 C) (Oral)   Resp 20   Ht 6' (1.829 m)   Wt 97.1 kg   SpO2 98%   BMI 29.02 kg/m   Physical Exam Constitutional:      General: He is not in acute distress.    Appearance: Normal appearance.  HENT:     Head: Normocephalic and atraumatic.     Mouth/Throat:     Mouth: Mucous membranes are moist.  Eyes:     Extraocular Movements: Extraocular movements intact.  Cardiovascular:     Rate and Rhythm: Normal rate and regular rhythm.  Pulmonary:     Effort: Pulmonary effort is normal. No respiratory distress.     Breath sounds: Normal breath sounds. No wheezing.  Abdominal:     General: Bowel sounds are normal. There is no distension.     Palpations: Abdomen is soft.     Tenderness: There is no abdominal tenderness.  Musculoskeletal:        General: Normal range of motion.     Cervical back: Normal range of motion and neck supple.  Skin:    General: Skin is warm and dry.  Neurological:     General: No focal deficit present.     Mental Status: He is alert.  Psychiatric:        Mood and Affect: Mood normal.        Behavior: Behavior normal.      The results of significant diagnostics from this hospitalization (including imaging, microbiology, ancillary and laboratory) are listed below for reference.   Microbiology: Recent Results (from the past 240 hours)  Culture, blood (single) w Reflex to ID Panel     Status: Abnormal   Collection Time: 01/06/24 10:43 AM   Specimen: Blood  Result Value Ref Range Status   MICRO NUMBER: [id number]  Final   SPECIMEN QUALITY: Suboptimal  Final   Source BLOOD 1  Final   STATUS: FINAL  Final   Result:   Final    Inspection of blood culture bottles indicates that an inadequate volume of blood may have been collected for the detection of sepsis.   ISOLATE 1: Enterococcus faecalis (AA)  Final    Comment: Enterococcus faecalis , from aerobic and anaerobic bottles   COMMENT: Aerobic and anaerobic bottle received.  Final      Susceptibility   Enterococcus faecalis - BLOOD CULTURE, POSITIVE 1    AMPICILLIN <=2 Sensitive     VANCOMYCIN  2 Sensitive     GENTAMICIN SYNERGY*  Sensitive      * This organism does not exhibit high level resistance to gentamicin which indicates that synergy of gentamicin with a penicillin is likely.     STREPTOMYCIN*  Sensitive      * This organism does not exhibit high level resistance to gentamicin which indicates that synergy of gentamicin with a penicillin is likely. This organism does not exhibit high level resistance to streptomycin which indicates that synergy of streptomycin with a penicillin is likely. Legend: S = Susceptible  I = Intermediate R = Resistant  NS = Not susceptible SDD = Susceptible Dose Dependent * = Not Tested  NR = Not Reported **NN = See Therapy Comments   Culture, blood (single) w Reflex to ID Panel     Status: Abnormal   Collection Time: 01/06/24 10:46 AM   Specimen: Blood  Result Value Ref Range Status   MICRO NUMBER: [id number]  Final   SPECIMEN QUALITY: Adequate  Final   Source BLOOD 2  Final   STATUS: FINAL  Final   ISOLATE 1: Enterococcus faecalis (AA)  Final    Comment: Enterococcus faecalis , from aerobic and anaerobic bottles   COMMENT: Aerobic and anaerobic bottle received.  Final      Susceptibility   Enterococcus faecalis - BLOOD CULTURE, POSITIVE 1    AMPICILLIN <=2 Sensitive     VANCOMYCIN 2 Sensitive     GENTAMICIN SYNERGY*  Sensitive      * This organism does not exhibit high level resistance to gentamicin which indicates that synergy of gentamicin with a penicillin is likely.     STREPTOMYCIN*  Sensitive      * This organism does not exhibit high level resistance to gentamicin which indicates that synergy of gentamicin with a penicillin is likely. This organism does not exhibit high level resistance to streptomycin which indicates that synergy of streptomycin with a penicillin is likely. Legend: S = Susceptible  I = Intermediate R = Resistant  NS = Not susceptible SDD = Susceptible Dose Dependent * = Not Tested  NR = Not  Reported **NN = See Therapy Comments   Blood culture (routine x 2)     Status: Abnormal   Collection Time: 01/07/24 10:51 AM   Specimen: BLOOD  Result Value Ref Range Status   Specimen Description   Final    BLOOD LEFT ANTECUBITAL Performed at [facility], [address], [place], [place] [address]    Special Requests   Final    BOTTLES DRAWN AEROBIC AND ANAEROBIC Blood Culture adequate volume Performed at [facility], [address] [place], [place] [address]    Culture  Setup Time   Final    GRAM POSITIVE COCCI IN BOTH AEROBIC AND ANAEROBIC BOTTLES CRITICAL RESULT CALLED TO, READ BACK BY AND VERIFIED WITH: PHARMD C SHADE 2/0/2025 @ 0725 BY AB Performed at [facility], [address], [place], [place] [address]    Culture ENTEROCOCCUS FAECALIS (A)  Final   Report Status 01/10/2024 FINAL  Final   Organism ID, Bacteria ENTEROCOCCUS FAECALIS  Final      Susceptibility   Enterococcus faecalis - MIC*    AMPICILLIN <=2 SENSITIVE Sensitive     VANCOMYCIN 2 SENSITIVE Sensitive     GENTAMICIN SYNERGY SENSITIVE Sensitive     * ENTEROCOCCUS FAECALIS  Blood Culture ID Panel (Reflexed)     Status: Abnormal   Collection Time: 01/07/24 10:51  AM  Result Value Ref Range Status   Enterococcus faecalis DETECTED (A) NOT DETECTED Final    Comment: CRITICAL RESULT CALLED TO, READ BACK BY AND VERIFIED WITH: PHARMD C SHADE 2/0/2025 @ 0725 BY AB    Enterococcus Faecium NOT DETECTED NOT DETECTED Final   Listeria monocytogenes NOT DETECTED NOT DETECTED Final   Staphylococcus species NOT DETECTED NOT DETECTED Final   Staphylococcus aureus (BCID) NOT DETECTED NOT DETECTED Final   Staphylococcus epidermidis NOT DETECTED NOT DETECTED Final   Staphylococcus lugdunensis NOT DETECTED NOT DETECTED Final   Streptococcus species NOT DETECTED NOT DETECTED Final   Streptococcus agalactiae NOT DETECTED NOT DETECTED Final   Streptococcus pneumoniae NOT DETECTED  NOT DETECTED Final   Streptococcus pyogenes NOT DETECTED NOT DETECTED Final   A.calcoaceticus-baumannii NOT DETECTED NOT DETECTED Final   Bacteroides fragilis NOT DETECTED NOT DETECTED Final   Enterobacterales NOT DETECTED NOT DETECTED Final   Enterobacter cloacae complex NOT DETECTED NOT DETECTED Final   Escherichia coli NOT DETECTED NOT DETECTED Final   Klebsiella aerogenes NOT DETECTED NOT DETECTED Final   Klebsiella oxytoca NOT DETECTED NOT DETECTED Final   Klebsiella pneumoniae NOT DETECTED NOT DETECTED Final   Proteus species NOT DETECTED NOT DETECTED Final   Salmonella species NOT DETECTED NOT DETECTED Final   Serratia marcescens NOT DETECTED NOT DETECTED Final   Haemophilus influenzae NOT DETECTED NOT DETECTED Final   Neisseria meningitidis NOT DETECTED NOT DETECTED Final   Pseudomonas aeruginosa NOT DETECTED NOT DETECTED Final   Stenotrophomonas maltophilia NOT DETECTED NOT DETECTED Final   Candida albicans NOT DETECTED NOT DETECTED Final   Candida auris NOT DETECTED NOT DETECTED Final   Candida glabrata NOT DETECTED NOT DETECTED Final   Candida krusei NOT DETECTED NOT DETECTED Final   Candida parapsilosis NOT DETECTED NOT DETECTED Final   Candida tropicalis NOT DETECTED NOT DETECTED Final   Cryptococcus neoformans/gattii NOT DETECTED NOT DETECTED Final   Vancomycin resistance NOT DETECTED NOT DETECTED Final    Comment: Performed at [facility], [address], [place], [place] [address]  Blood culture (routine x 2)     Status: Abnormal   Collection Time: 01/07/24 11:25 AM   Specimen: BLOOD  Result Value Ref Range Status   Specimen Description   Final    BLOOD LEFT ANTECUBITAL Performed at [facility], [address], [place], [place] [address]    Special Requests   Final    BOTTLES DRAWN AEROBIC AND ANAEROBIC Blood Culture adequate volume Performed at [facility], [address], [place], [place] [address]    Culture   Setup Time   Final    GRAM POSITIVE COCCI IN BOTH AEROBIC AND ANAEROBIC BOTTLES CRITICAL VALUE NOTED.  VALUE IS CONSISTENT WITH PREVIOUSLY REPORTED AND CALLED VALUE.    Culture (A)  Final    ENTEROCOCCUS FAECALIS SUSCEPTIBILITIES PERFORMED ON PREVIOUS CULTURE WITHIN THE LAST 5 DAYS. Performed at [facility], [address] [place], [place] [address]    Report Status 01/10/2024 FINAL  Final  Culture, blood (Routine X 2) w Reflex to ID Panel     Status: None (Preliminary result)   Collection Time: 01/08/24  4:14 AM   Specimen: BLOOD RIGHT ARM  Result Value Ref Range Status   Specimen Description   Final    BLOOD RIGHT ARM Performed at [facility], [address] [place], [place] [address]    Special Requests   Final    BOTTLES DRAWN  AEROBIC ONLY Blood Culture results may not be optimal due to an inadequate volume of blood received in culture bottles Performed at [facility], [address], [place], [place] [address]    Culture   Final    NO GROWTH 4 DAYS Performed at [facility], [address], [place], [place] [address]    Report Status PENDING  Incomplete  Culture, blood (Routine X 2) w Reflex to ID Panel     Status: None (Preliminary result)   Collection Time: 01/08/24  4:25 AM   Specimen: BLOOD LEFT HAND  Result Value Ref Range Status   Specimen Description   Final    BLOOD LEFT HAND Performed at [facility], [address] [place], [place] [address]    Special Requests   Final    BOTTLES DRAWN AEROBIC AND ANAEROBIC Blood Culture results may not be optimal due to an inadequate volume of blood received in culture bottles Performed at [facility], [address], [place], [place] [address]    Culture   Final    NO GROWTH 4 DAYS Performed at [facility], [address], [place], [place] [address]    Report Status PENDING  Incomplete     Labs: BNP (last 3 results) No results for input(s):  "BNP" in the last 8760 hours. Basic Metabolic Panel: Recent Labs  Lab 01/07/24 1053 01/08/24 0425 01/09/24 0412 01/11/24 0351  NA 133* 134* 137 137  K 4.2 4.0 4.3 4.0  CL 101 104 103 104  CO2 21* 21* 25 22  GLUCOSE 96 116* 107* 103*  BUN 16 17 16 17   CREATININE 1.17 1.03 1.02 0.96  CALCIUM 9.0 8.7* 8.7* 8.8*  MG  --   --  2.4 2.4  PHOS  --   --  4.1 4.0   Liver Function Tests: Recent Labs  Lab 01/07/24 1053 01/07/24 2136 01/08/24 0425 01/09/24 0412 01/11/24 0351  AST 33 33 29 25 27   ALT 63* 61* 58* 50* 51*  ALKPHOS 70 68 64 60 55  BILITOT 1.2 0.8 0.9 0.7 0.6  PROT 7.7 7.5 7.4 7.0 6.6  ALBUMIN 3.6 3.4* 3.5 3.2* 3.0*   No results for input(s): "LIPASE", "AMYLASE" in the last 168 hours. No results for input(s): "AMMONIA" in the last 168 hours. CBC: Recent Labs  Lab 01/07/24 1053 01/08/24 0425 01/09/24 0412 01/11/24 0351  WBC 7.8 7.1 6.0 5.0  NEUTROABS 5.5  --  3.6 2.8  HGB 13.5 12.3* 11.9* 11.6*  HCT 40.2 36.9* 36.5* 34.9*  MCV 86.8 88.5 88.6 87.9  PLT 298 258 236 222   Cardiac Enzymes: No results for input(s): "CKTOTAL", "CKMB", "CKMBINDEX", "TROPONINI" in the last 168 hours. BNP: Invalid input(s): "POCBNP" CBG: No results for input(s): "GLUCAP" in the last 168 hours. D-Dimer No results for input(s): "DDIMER" in the last 72 hours. Hgb A1c No results for input(s): "HGBA1C" in the last 72 hours. Lipid Profile No results for input(s): "CHOL", "HDL", "LDLCALC", "TRIG", "CHOLHDL", "LDLDIRECT" in the last 72 hours. Thyroid function studies No results for input(s): "TSH", "T4TOTAL", "T3FREE", "THYROIDAB" in the last 72 hours.  Invalid input(s): "FREET3" Anemia work up No results for input(s): "VITAMINB12", "FOLATE", "FERRITIN", "TIBC", "IRON", "RETICCTPCT" in the last 72 hours. Urinalysis No results found for: "COLORURINE", "APPEARANCEUR", "LABSPEC", "PHURINE", "GLUCOSEU", "HGBUR", "BILIRUBINUR", "KETONESUR", "PROTEINUR", "UROBILINOGEN", "NITRITE",  "LEUKOCYTESUR" Sepsis Labs Recent Labs  Lab 01/07/24 1053 01/08/24 0425 01/09/24 0412 01/11/24 0351  WBC 7.8 7.1 6.0  5.0   Microbiology Recent Results (from the past 240 hours)  Culture, blood (single) w Reflex to ID Panel     Status: Abnormal   Collection Time: 01/06/24 10:43 AM   Specimen: Blood  Result Value Ref Range Status   MICRO NUMBER: [id number]  Final   SPECIMEN QUALITY: Suboptimal  Final   Source BLOOD 1  Final   STATUS: FINAL  Final   Result:   Final    Inspection of blood culture bottles indicates that an inadequate volume of blood may have been collected for the detection of sepsis.   ISOLATE 1: Enterococcus faecalis (AA)  Final    Comment: Enterococcus faecalis , from aerobic and anaerobic bottles   COMMENT: Aerobic and anaerobic bottle received.  Final      Susceptibility   Enterococcus faecalis - BLOOD CULTURE, POSITIVE 1    AMPICILLIN <=2 Sensitive     VANCOMYCIN 2 Sensitive     GENTAMICIN SYNERGY*  Sensitive      * This organism does not exhibit high level resistance to gentamicin which indicates that synergy of gentamicin with a penicillin is likely.     STREPTOMYCIN*  Sensitive      * This organism does not exhibit high level resistance to gentamicin which indicates that synergy of gentamicin with a penicillin is likely. This organism does not exhibit high level resistance to streptomycin which indicates that synergy of streptomycin with a penicillin is likely. Legend: S = Susceptible  I = Intermediate R = Resistant  NS = Not susceptible SDD = Susceptible Dose Dependent * = Not Tested  NR = Not Reported **NN = See Therapy Comments   Culture, blood (single) w Reflex to ID Panel     Status: Abnormal   Collection Time: 01/06/24 10:46 AM   Specimen: Blood  Result Value Ref Range Status   MICRO NUMBER: [id number]  Final   SPECIMEN QUALITY: Adequate  Final   Source BLOOD 2  Final   STATUS: FINAL  Final   ISOLATE 1: Enterococcus faecalis (AA)  Final     Comment: Enterococcus faecalis , from aerobic and anaerobic bottles   COMMENT: Aerobic and anaerobic bottle received.  Final      Susceptibility   Enterococcus faecalis - BLOOD CULTURE, POSITIVE 1    AMPICILLIN <=2 Sensitive     VANCOMYCIN 2 Sensitive     GENTAMICIN SYNERGY*  Sensitive      * This organism does not exhibit high level resistance to gentamicin which indicates that synergy of gentamicin with a penicillin is likely.     STREPTOMYCIN*  Sensitive      * This organism does not exhibit high level resistance to gentamicin which indicates that synergy of gentamicin with a penicillin is likely. This organism does not exhibit high level resistance to streptomycin which indicates that synergy of streptomycin with a penicillin is likely. Legend: S = Susceptible  I = Intermediate R = Resistant  NS = Not susceptible SDD = Susceptible Dose Dependent * = Not Tested  NR = Not Reported **NN = See Therapy Comments   Blood culture (routine x 2)     Status: Abnormal   Collection Time: 01/07/24 10:51 AM   Specimen: BLOOD  Result Value Ref Range Status   Specimen Description   Final    BLOOD LEFT ANTECUBITAL Performed at [facility], [address], [place], [place] [address]    Special Requests   Final    BOTTLES DRAWN AEROBIC AND  ANAEROBIC Blood Culture adequate volume Performed at [facility], [address], [place], [place] [address]    Culture  Setup Time   Final    GRAM POSITIVE COCCI IN BOTH AEROBIC AND ANAEROBIC BOTTLES CRITICAL RESULT CALLED TO, READ BACK BY AND VERIFIED WITH: PHARMD C SHADE 2/0/2025 @ 0725 BY AB Performed at [facility], [address] [place], [place] [address]    Culture ENTEROCOCCUS FAECALIS (A)  Final   Report Status 01/10/2024 FINAL  Final   Organism ID, Bacteria ENTEROCOCCUS FAECALIS  Final      Susceptibility   Enterococcus faecalis - MIC*    AMPICILLIN <=2 SENSITIVE Sensitive      VANCOMYCIN 2 SENSITIVE Sensitive     GENTAMICIN SYNERGY SENSITIVE Sensitive     * ENTEROCOCCUS FAECALIS  Blood Culture ID Panel (Reflexed)     Status: Abnormal   Collection Time: 01/07/24 10:51 AM  Result Value Ref Range Status   Enterococcus faecalis DETECTED (A) NOT DETECTED Final    Comment: CRITICAL RESULT CALLED TO, READ BACK BY AND VERIFIED WITH: PHARMD C SHADE 2/0/2025 @ 0725 BY AB    Enterococcus Faecium NOT DETECTED NOT DETECTED Final   Listeria monocytogenes NOT DETECTED NOT DETECTED Final   Staphylococcus species NOT DETECTED NOT DETECTED Final   Staphylococcus aureus (BCID) NOT DETECTED NOT DETECTED Final   Staphylococcus epidermidis NOT DETECTED NOT DETECTED Final   Staphylococcus lugdunensis NOT DETECTED NOT DETECTED Final   Streptococcus species NOT DETECTED NOT DETECTED Final   Streptococcus agalactiae NOT DETECTED NOT DETECTED Final   Streptococcus pneumoniae NOT DETECTED NOT DETECTED Final   Streptococcus pyogenes NOT DETECTED NOT DETECTED Final   A.calcoaceticus-baumannii NOT DETECTED NOT DETECTED Final   Bacteroides fragilis NOT DETECTED NOT DETECTED Final   Enterobacterales NOT DETECTED NOT DETECTED Final   Enterobacter cloacae complex NOT DETECTED NOT DETECTED Final   Escherichia coli NOT DETECTED NOT DETECTED Final   Klebsiella aerogenes NOT DETECTED NOT DETECTED Final   Klebsiella oxytoca NOT DETECTED NOT DETECTED Final   Klebsiella pneumoniae NOT DETECTED NOT DETECTED Final   Proteus species NOT DETECTED NOT DETECTED Final   Salmonella species NOT DETECTED NOT DETECTED Final   Serratia marcescens NOT DETECTED NOT DETECTED Final   Haemophilus influenzae NOT DETECTED NOT DETECTED Final   Neisseria meningitidis NOT DETECTED NOT DETECTED Final   Pseudomonas aeruginosa NOT DETECTED NOT DETECTED Final   Stenotrophomonas maltophilia NOT DETECTED NOT DETECTED Final   Candida albicans NOT DETECTED NOT DETECTED Final   Candida auris NOT DETECTED NOT DETECTED Final    Candida glabrata NOT DETECTED NOT DETECTED Final   Candida krusei NOT DETECTED NOT DETECTED Final   Candida parapsilosis NOT DETECTED NOT DETECTED Final   Candida tropicalis NOT DETECTED NOT DETECTED Final   Cryptococcus neoformans/gattii NOT DETECTED NOT DETECTED Final   Vancomycin resistance NOT DETECTED NOT DETECTED Final    Comment: Performed at [facility], [address], [place], [place] [address]  Blood culture (routine x 2)     Status: Abnormal   Collection Time: 01/07/24 11:25 AM   Specimen: BLOOD  Result Value Ref Range Status   Specimen Description   Final    BLOOD LEFT ANTECUBITAL Performed at [facility], [address] [place], [place] [address]    Special Requests   Final    BOTTLES DRAWN AEROBIC AND ANAEROBIC Blood Culture adequate volume Performed at [facility], [address] [place], [place] [address]  Culture  Setup Time   Final    GRAM POSITIVE COCCI IN BOTH AEROBIC AND ANAEROBIC BOTTLES CRITICAL VALUE NOTED.  VALUE IS CONSISTENT WITH PREVIOUSLY REPORTED AND CALLED VALUE.    Culture (A)  Final    ENTEROCOCCUS FAECALIS SUSCEPTIBILITIES PERFORMED ON PREVIOUS CULTURE WITHIN THE LAST 5 DAYS. Performed at [facility], [address] [place], [place] [address]    Report Status 01/10/2024 FINAL  Final  Culture, blood (Routine X 2) w Reflex to ID Panel     Status: None (Preliminary result)   Collection Time: 01/08/24  4:14 AM   Specimen: BLOOD RIGHT ARM  Result Value Ref Range Status   Specimen Description   Final    BLOOD RIGHT ARM Performed at [facility], [address] [place], [place] [address]    Special Requests   Final    BOTTLES DRAWN AEROBIC ONLY Blood Culture results may not be optimal due to an inadequate volume of blood received in culture bottles Performed at [facility], [address] [place], [place] [address]    Culture   Final    NO GROWTH 4  DAYS Performed at [facility], [address], [place], [place] [address]    Report Status PENDING  Incomplete  Culture, blood (Routine X 2) w Reflex to ID Panel     Status: None (Preliminary result)   Collection Time: 01/08/24  4:25 AM   Specimen: BLOOD LEFT HAND  Result Value Ref Range Status   Specimen Description   Final    BLOOD LEFT HAND Performed at [facility], [address] [place], [place] [address]    Special Requests   Final    BOTTLES DRAWN AEROBIC AND ANAEROBIC Blood Culture results may not be optimal due to an inadequate volume of blood received in culture bottles Performed at [facility], [address] [place], [place] [address]    Culture   Final    NO GROWTH 4 DAYS Performed at [facility], [address] [place], [place] [address]    Report Status PENDING  Incomplete    Procedures/Studies: [place] EKG SITE RITE Result Date: 01/11/2024 If Site Rite image not attached, placement could not be confirmed due to current cardiac rhythm.  ECHO TEE Result Date: 01/10/2024    TRANSESOPHOGEAL ECHO REPORT   Patient Name:   Robert Madden Date of Exam: 01/10/2024 Medical Rec #:  [id number]         Height:       72.0 in Accession #:    [id number]        Weight:       214.0 lb Date of Birth:  November 08, 1961         BSA:          2.193 m Patient Age:    62 years          BP:           125/79 mmHg Patient Gender: M                 HR:           78 bpm. Exam Location:  Inpatient Procedure: 3D Echo, Transesophageal Echo, Cardiac Doppler and Color Doppler Indications:    Endocarditis  History:        Patient has prior history of Echocardiogram examinations, most  recent 01/07/2024.  Sonographer:    Harriette Bouillon RDCS Referring Phys: [id number] Little Ishikawa PROCEDURE: The transesophogeal probe was passed without difficulty through the esophogus of the patient. Sedation performed by different physician. The patient developed no  complications during the procedure.  IMPRESSIONS  1. Small echodensity on noncoronary cusp of aortic valve measuring 0.5cm, concerning for vegetation. Moderate AI  2. Left ventricular ejection fraction, by estimation, is 60 to 65%. The left ventricle has normal function.  3. Right ventricular systolic function is normal. The right ventricular size is normal.  4. No left atrial/left atrial appendage thrombus was detected.  5. The mitral valve is normal in structure. Mild mitral valve regurgitation.  6. The aortic valve is tricuspid. Aortic valve regurgitation is moderate. No aortic stenosis is present.  7. Aortic dilatation noted. 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 FINDINGS  Left Ventricle: Left ventricular ejection fraction, by estimation, is 60 to 65%. The left ventricle has normal function. The left ventricular internal cavity size was normal in size. Right Ventricle: The right ventricular size is normal. No increase in right ventricular wall thickness. Right ventricular systolic function is normal. Left Atrium: Left atrial size was normal in size. No left atrial/left atrial appendage thrombus was detected. Right Atrium: Right atrial size was normal in size. Pericardium: There is no evidence of pericardial effusion. Mitral Valve: The mitral valve is normal in structure. Mild mitral valve regurgitation. Tricuspid Valve: The tricuspid valve is normal in structure. Tricuspid valve regurgitation is trivial. Aortic Valve: The aortic valve is tricuspid. Aortic valve regurgitation is moderate. Aortic regurgitation PHT measures 346 msec. No aortic stenosis is present. Pulmonic Valve: The pulmonic valve was not well visualized. Pulmonic valve regurgitation is trivial. Aorta: Aortic dilatation noted. There is dilatation of the ascending aorta, measuring 42 mm. There is an aneurysm involving the aortic root measuring 46 mm. IAS/Shunts: No atrial level  shunt detected by color flow Doppler.  AORTIC VALVE AI PHT:      346 msec  AORTA Ao Root diam: 4.60 cm Ao Asc diam:  4.20 cm Epifanio Lesches MD Electronically signed by Epifanio Lesches MD Signature Date/Time: 01/10/2024/5:20:59 PM    Final    EP STUDY Result Date: 01/10/2024 See surgical note for result.  MR BRAIN W WO CONTRAST Result Date: 01/07/2024 CLINICAL DATA:  Initial evaluation for brain abscess, gram-positive bacteremia, subacute endocarditis, headaches. EXAM: MRI HEAD WITHOUT AND WITH CONTRAST TECHNIQUE: Multiplanar, multiecho pulse sequences of the brain and surrounding structures were obtained without and with intravenous contrast. CONTRAST:  10mL GADAVIST GADOBUTROL 1 MMOL/ML IV SOLN COMPARISON:  Prior head CT from 05/30/2009. FINDINGS: Brain: Cerebral volume within normal limits. Few scattered subcentimeter foci of T2/FLAIR hyperintensity noted involving the periventricular deep white matter of both cerebral hemispheres, most pronounced about the frontal lobes, nonspecific, but most commonly related to mild chronic microvascular ischemic disease. Changes are mild in nature. Single punctate focus of diffusion signal abnormality seen involving the high left frontal lobe (series 5, image 47), suspicious for a tiny acute to subacute ischemic infarct. No associated hemorrhage. No other evidence for acute or subacute ischemia. Gray-white matter adjacent otherwise maintained. No acute or chronic intracranial blood products. No mass lesion, midline shift or mass effect. No hydrocephalus or extra-axial fluid collection. Pituitary gland suprasellar region within normal limits. No abnormal enhancement. No evidence for brain abscess or other CNS infection. Vascular: Major intracranial vascular flow voids are maintained. Skull  and upper cervical spine: Craniocervical junction within normal limits. Bone marrow signal intensity normal. No scalp soft tissue abnormality. Sinuses/Orbits: Globes orbital  soft tissues within normal limits. Mild scattered mucosal thickening noted about the ethmoidal air cells. Paranasal sinuses are otherwise clear. Trace right mastoid effusion noted, of doubtful significance. Other: None. IMPRESSION: 1. Single punctate focus of diffusion signal abnormality involving the high left frontal lobe, suspicious for a tiny acute to subacute ischemic infarct. No associated hemorrhage. 2. No other acute intracranial abnormality. No evidence for brain abscess or other CNS infection. 3. Underlying mild cerebral white matter disease, nonspecific, but most commonly related to chronic microvascular ischemic disease. Electronically Signed   By: Rise Mu M.D.   On: 01/07/2024 20:47   US Abdomen Limited RUQ (LIVER/GB) Result Date: 01/07/2024 CLINICAL DATA:  Transaminitis EXAM: ULTRASOUND ABDOMEN LIMITED RIGHT UPPER QUADRANT COMPARISON:  None Available. FINDINGS: Gallbladder: No gallstones or wall thickening visualized. No sonographic Murphy sign noted by sonographer. Common bile duct: Diameter: 2.5 mm Liver: No focal lesion identified. Within normal limits in parenchymal echogenicity. Portal vein is patent on color Doppler imaging with normal direction of blood flow towards the liver. Other: None. IMPRESSION: No cholelithiasis or sonographic evidence for acute cholecystitis. Electronically Signed   By: Annia Belt M.D.   On: 01/07/2024 16:29   ECHOCARDIOGRAM COMPLETE Result Date: 01/07/2024    ECHOCARDIOGRAM REPORT   Patient Name:   Robert Madden Date of Exam: 01/07/2024 Medical Rec #:  [id number]         Height:       72.0 in Accession #:    [id number]        Weight:       214.0 lb Date of Birth:  10-19-1961         BSA:          2.193 m Patient Age:    62 years          BP:           122/73 mmHg Patient Gender: M                 HR:           75 bpm. Exam Location:  Inpatient Procedure: 2D Echo, Cardiac Doppler and Color Doppler Indications:    Bacteremia R78.81  History:         Patient has no prior history of Echocardiogram examinations.                 Signs/Symptoms:Bacteremia.  Sonographer:    Webb Laws Referring Phys: [id number] Corney Knighton MANUEL ORTIZ IMPRESSIONS  1. Left ventricular ejection fraction, by estimation, is 65 to 70%. The left ventricle has hyperdynamic function. The left ventricle has no regional wall motion abnormalities. There is mild asymmetric left ventricular hypertrophy of the basal-septal segment. Left ventricular diastolic parameters were normal.  2. Right ventricular systolic function is normal. The right ventricular size is normal.  3. The mitral valve is normal in structure. Mild mitral valve regurgitation. No evidence of mitral stenosis.  4. Small mobile echodensity seen on the noncoronary cusp of the aortic valve, best seen in the parasternal long axis view. The aortic valve is normal in structure. Aortic valve regurgitation is mild. No aortic stenosis is present.  5. Aortic dilatation noted. 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  6. The inferior vena cava is normal in size with  greater than 50% respiratory variability, suggesting right atrial pressure of 3 mmHg. Conclusion(s)/Recommendation(s): Cannot exclude small aortic valve vegetation. Consider TEE if clinically indicated. FINDINGS  Left Ventricle: Left ventricular ejection fraction, by estimation, is 65 to 70%. The left ventricle has hyperdynamic function. The left ventricle has no regional wall motion abnormalities. The left ventricular internal cavity size was normal in size. There is mild asymmetric left ventricular hypertrophy of the basal-septal segment. Left ventricular diastolic parameters were normal. Right Ventricle: The right ventricular size is normal. No increase in right ventricular wall thickness. Right ventricular systolic function is normal. Left Atrium: Left atrial size was normal in size.  Right Atrium: Right atrial size was normal in size. Pericardium: There is no evidence of pericardial effusion. Mitral Valve: The mitral valve is normal in structure. Mild mitral valve regurgitation. No evidence of mitral valve stenosis. There is no evidence of mitral valve vegetation. Tricuspid Valve: The tricuspid valve is normal in structure. Tricuspid valve regurgitation is not demonstrated. No evidence of tricuspid stenosis. Aortic Valve: Small mobile echodensity seen on the noncoronary cusp of the aortic valve, best seen in the parasternal long axis view. The aortic valve is normal in structure. Aortic valve regurgitation is mild. Aortic regurgitation PHT measures 562 msec.  No aortic stenosis is present. Pulmonic Valve: The pulmonic valve was normal in structure. Pulmonic valve regurgitation is not visualized. No evidence of pulmonic stenosis. Aorta: Aortic dilatation noted. 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 Venous: The inferior vena cava is normal in size with greater than 50% respiratory variability, suggesting right atrial pressure of 3 mmHg. IAS/Shunts: No atrial level shunt detected by color flow Doppler.  LEFT VENTRICLE PLAX 2D LVIDd:         4.60 cm     Diastology LVIDs:         2.70 cm     LV e' medial:    6.85 cm/s LV PW:         1.10 cm     LV E/e' medial:  12.6 LV IVS:        1.40 cm     LV e' lateral:   8.49 cm/s LVOT diam:     2.40 cm     LV E/e' lateral: 10.2 LV SV:         127 LV SV Index:   58 LVOT Area:     4.52 cm  LV Volumes (MOD) LV vol d, MOD A2C: 68.0 ml LV vol d, MOD A4C: 80.0 ml LV vol s, MOD A2C: 20.8 ml LV vol s, MOD A4C: 23.1 ml LV SV MOD A2C:     47.2 ml LV SV MOD A4C:     80.0 ml LV SV MOD BP:      51.4 ml RIGHT VENTRICLE RV Basal diam:  3.20 cm RV S prime:     16.30 cm/s TAPSE (M-mode): 3.0 cm LEFT ATRIUM           Index        RIGHT ATRIUM           Index LA diam:      3.80 cm  1.73 cm/m   RA Area:     10.30 cm LA Vol (A2C): 65.6 ml 29.92 ml/m  RA Volume:   17.20 ml  7.84 ml/m LA Vol (A4C): 43.0 ml 19.61 ml/m  AORTIC VALVE LVOT Vmax:   144.00 cm/s LVOT  Vmean:  101.000 cm/s LVOT VTI:    0.280 m AI PHT:      562 msec  AORTA Ao Root diam: 4.10 cm Ao Asc diam:  4.35 cm MITRAL VALVE MV Area (PHT): 3.60 cm    SHUNTS MV Decel Time: 211 msec    Systemic VTI:  0.28 m MV E velocity: 86.60 cm/s  Systemic Diam: 2.40 cm MV A velocity: 60.90 cm/s MV E/A ratio:  1.42 Clearnce Hasten Electronically signed by Clearnce Hasten Signature Date/Time: 01/07/2024/4:05:19 PM    Final    DG Chest 2 View Result Date: 01/07/2024 CLINICAL DATA:  Bacteremia, fever EXAM: CHEST - 2 VIEW COMPARISON:  None Available. FINDINGS: The heart size and mediastinal contours are within normal limits. Both lungs are clear. No visible pleural effusions or pneumothorax. No acute osseous abnormality. IMPRESSION: No active cardiopulmonary disease. Electronically Signed   By: Feliberto Harts M.D.   On: 01/07/2024 12:26     Time coordinating discharge: Over 30 minutes    Lewie Chamber, MD  [facility] 01/12/2024, 12:55 PM

## 2024-01-12 NOTE — Telephone Encounter (Signed)
asked to order echocardiogram to be done in approximately 6 weeks for endocarditis.  Will schedule follow-up closer to 7-8 weeks to ensure follow-up provider has results

## 2024-01-12 NOTE — TOC Transition Note (Signed)
Transition of Care [facility]) - Discharge Note  Patient Details  Name: Robert Madden MRN: [id number] Date of Birth: [birth year]  Transition of Care [facility]) CM/SW Contact:  Ewing Schlein, LCSW Phone Number: 01/12/2024, 11:44 AM  Clinical Narrative: [facility] is not able to provide [facility] through the patient's insurance, so [facility] will provide both the IV antibiotics and HHRN. CSW confirmed with Pam with [employer] she has the orders. CSW updated patient. Patient will discharge this afternoon after his dose and teaching with Pam. TOC signing off.    Final next level of care: Home w Home Health Services Barriers to Discharge: Barriers Resolved  Patient Goals and CMS Choice Patient states their goals for this hospitalization and ongoing recovery are:: Go home CMS Medicare.gov Compare Post Acute Care list provided to:: Patient Choice offered to / list presented to : Patient  Discharge Plan and Services Additional resources added to the After Visit Summary for   In-house Referral: Clinical Social Work Post Acute Care Choice: Home Health          DME Arranged: N/A DME Agency: NA HH Arranged: RN, IV Antibiotics HH Agency: [facility] Date HH Agency Contacted: 01/11/24 Representative spoke with at [facility] Agency: Pam  Social Drivers of Health (SDOH) Interventions SDOH Screenings   Food Insecurity: No Food Insecurity (01/07/2024)  Housing: Low Risk  (01/07/2024)  Transportation Needs: No Transportation Needs (01/07/2024)  Utilities: Not At Risk (01/07/2024)  Depression (PHQ2-9): Low Risk  (01/25/2019)  Tobacco Use: Low Risk  (01/07/2024)   Readmission Risk Interventions     No data to display

## 2024-01-13 ENCOUNTER — Other Ambulatory Visit (HOSPITAL_COMMUNITY): Payer: 59

## 2024-01-13 LAB — CULTURE, BLOOD (ROUTINE X 2)
Culture: NO GROWTH
Culture: NO GROWTH

## 2024-01-18 ENCOUNTER — Telehealth: Payer: Self-pay

## 2024-01-18 ENCOUNTER — Encounter: Payer: Self-pay | Admitting: Internal Medicine

## 2024-01-18 ENCOUNTER — Encounter: Payer: Self-pay | Admitting: Infectious Disease

## 2024-01-18 DIAGNOSIS — R21 Rash and other nonspecific skin eruption: Secondary | ICD-10-CM | POA: Insufficient documentation

## 2024-01-18 DIAGNOSIS — A498 Other bacterial infections of unspecified site: Secondary | ICD-10-CM

## 2024-01-18 HISTORY — DX: Other bacterial infections of unspecified site: A49.8

## 2024-01-18 HISTORY — DX: Rash and other nonspecific skin eruption: R21

## 2024-01-18 NOTE — Telephone Encounter (Signed)
Received voicemail from patient, states he's on ampicillin. Had childhood allergy to ampicillin but tolerated challenge while inpatient.   He now has a rash on his back, neck, chest, and legs. Describes the rash as itchy, but not unbearable. Is going to try benadryl. Denies any wheezing or difficulty breathing, advised him if those symptoms develop he should go to emergency room.   Infusion is continuous and he has not held any doses. Routing to provider and pharmacy team.   Sandie Ano, RN

## 2024-01-18 NOTE — Telephone Encounter (Signed)
Spoke with Tammy Sours, notified him Dr. Thedore Mins would like him to stop both ampicillin and ceftriaxone. He will send pictures via MyChart. Scheduled with Dr. Daiva Eves tomorrow.   Notified Jeri Modena, RN with [employer] that patient will be switching to dapto. Will send updated orders once dose is finalized.   Patient is concerned about possibly being without antibiotics for a few days due to inclement weather delaying shipment of dapto.   Sandie Ano, RN

## 2024-01-18 NOTE — Progress Notes (Unsigned)
Subjective:  Complaint follow-up for endocarditis now with rash   Patient ID: Robert Madden, male    DOB: July 16, 1961, 63 y.o.   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 He had childhood allergy to PCN but tolerated amoxicillin challenge and was placed on dual beta lactam therapy with continuous IV ampicillin + ceftriaxone 2 grams IV q 12 hours but then developed a rash within the last 3 days  and was switched to daptomycin.  Discussed the use of AI scribe software for clinical note transcription with the patient, who gave verbal consent to proceed.  History of Present Illness   The patient, with a history of heart valve infection (aortic valve), presents with a rash. He was previously treated with IV ampicillin and ceftriaxone for the infection. However, he developed a rash, which he believes is due to an allergic reaction to ampicillin. The rash is not itchy, but it is widespread, covering his back, chest, and face. The rash started a couple of days ago, initially on his back, and has since spread. He has a history of penicillin allergy from childhood, which was thought to have resolved when he had no problems with the amoxicillin challenge.. He has changed to daptomycin, and the previous antibiotics were stopped yesterday. He also has a PICC line in place, which appears to be in good condition. He also has a history of aortic aneurysm, which is being monitored.       .  Past Medical History:  Diagnosis Date   Aortic insufficiency    Hyperlipidemia    IBS (irritable bowel syndrome)    Tachycardia, paroxysmal (HCC)     Past Surgical History:  Procedure Laterality Date   TRANSESOPHAGEAL ECHOCARDIOGRAM (CATH LAB) N/A 01/10/2024   Procedure: TRANSESOPHAGEAL ECHOCARDIOGRAM;  Surgeon: Little Ishikawa, MD;  Location: [facility] INVASIVE CV LAB;  Service: Cardiovascular;  Laterality: N/A;    Family History  Problem  Relation Age of Onset   Hypertension Mother    Heart attack Neg Hx    Stroke Neg Hx       Social History   Socioeconomic History   Marital status: Married    Spouse name: Not on file   Number of children: 2   Years of education: Not on file   Highest education level: Not on file  Occupational History   Occupation: school principal  Tobacco Use   Smoking status: Never   Smokeless tobacco: Never  Vaping Use   Vaping status: Never Used  Substance and Sexual Activity   Alcohol use: No   Drug use: No   Sexual activity: Not on file  Other Topics Concern   Not on file  Social History Narrative   Not on file   Social Drivers of Health   Financial Resource Strain: Not on file  Food Insecurity: No Food Insecurity (01/07/2024)   Hunger Vital Sign    Worried About Running Out of Food in the Last Year: Never true    Ran Out of Food in the Last Year: Never true  Transportation Needs: No Transportation Needs (01/07/2024)   PRAPARE - Administrator, Civil Service (Medical): No    Lack of Transportation (Non-Medical): No  Physical Activity: Not on file  Stress: Not on file  Social Connections: Not on file    Allergies  Allergen Reactions   Atorvastatin     Other reaction(s): joint aches  Simvastatin Other (See Comments)    Joint aches   Honey Rash     Current Outpatient Medications:    ampicillin IVPB, Inject 12 g into the vein daily. As a continuous infusion. Indication:  Enterococcal endocarditis  First Dose: Yes Last Day of Therapy:  02/19/24 Labs - Once weekly:  CBC/D and BMP, Labs - Once weekly: ESR and CRP Method of administration: Ambulatory Pump (Continuous Infusion) Method of administration may be changed at the discretion of home infusion pharmacist based upon assessment of the patient and/or caregiver's ability to self-administer the medication ordered., Disp: 38 Units, Rfl: 0   cefTRIAXone (ROCEPHIN) IVPB, Inject 2 g into the vein every 12 (twelve) hours.  Indication:  Enterococcal endocarditis  First Dose: Yes Last Day of Therapy:  02/19/24 Labs - Once weekly:  CBC/D and BMP, Labs - Once weekly: ESR and CRP Method of administration: IV Push Method of administration may be changed at the discretion of home infusion pharmacist based upon assessment of the patient and/or caregiver's ability to self-administer the medication ordered., Disp: 76 Units, Rfl: 0   ibuprofen (ADVIL) 200 MG tablet, Take 200 mg by mouth every 4 (four) hours as needed for headache or mild pain (pain score 1-3)., Disp: , Rfl:    metoprolol succinate (TOPROL-XL) 50 MG 24 hr tablet, Take 1 tablet (50 mg total) by mouth daily., Disp: 90 tablet, Rfl: 3   pravastatin (PRAVACHOL) 40 MG tablet, takes 40 mg every day (Patient not taking: Reported on 01/06/2024), Disp: , Rfl: 12   Review of Systems  Constitutional:  Negative for activity change, appetite change, chills, diaphoresis, fatigue, fever and unexpected weight change.  HENT:  Negative for congestion, rhinorrhea, sinus pressure, sneezing, sore throat and trouble swallowing.   Eyes:  Negative for photophobia and visual disturbance.  Respiratory:  Negative for cough, chest tightness, shortness of breath, wheezing and stridor.   Cardiovascular:  Negative for chest pain, palpitations and leg swelling.  Gastrointestinal:  Negative for abdominal distention, abdominal pain, anal bleeding, blood in stool, constipation, diarrhea, nausea and vomiting.  Genitourinary:  Negative for difficulty urinating, dysuria, flank pain and hematuria.  Musculoskeletal:  Negative for arthralgias, back pain, gait problem, joint swelling and myalgias.  Skin:  Positive for rash. Negative for color change, pallor and wound.  Neurological:  Negative for dizziness, tremors, weakness and light-headedness.  Hematological:  Negative for adenopathy. Does not bruise/bleed easily.  Psychiatric/Behavioral:  Negative for agitation, behavioral problems, confusion, decreased  concentration, dysphoric mood and sleep disturbance.        Objective:   Physical Exam Constitutional:      Appearance: He is well-developed.  HENT:     Head: Normocephalic and atraumatic.  Eyes:     Conjunctiva/sclera: Conjunctivae normal.  Cardiovascular:     Rate and Rhythm: Normal rate and regular rhythm.  Pulmonary:     Effort: Pulmonary effort is normal. No respiratory distress.     Breath sounds: No wheezing.  Abdominal:     General: There is no distension.     Palpations: Abdomen is soft.  Musculoskeletal:        General: No tenderness. Normal range of motion.     Cervical back: Normal range of motion and neck supple.  Skin:    General: Skin is warm and dry.     Coloration: Skin is not pale.     Findings: Rash present. No erythema.  Neurological:     General: No focal deficit present.     Mental  Status: He is alert and oriented to person, place, and time.  Psychiatric:        Mood and Affect: Mood normal.        Behavior: Behavior normal.        Thought Content: Thought content normal.        Judgment: Judgment normal.     Rash        PICC line       Assessment & Plan:   Assessment and Plan    Endocarditis Patient developed a rash after starting IV ampicillin and ceftriaxone for treatment of heart valve infection. The rash is likely due to ampicillin allergy. -Discontinue ampicillin and ceftriaxone. -Start daptomycin, monitor CPK weekly. -Advise patient to report any new symptoms such as coughing or difficulty breathing, which could indicate rare side effect of eosinophilic pneumonia. -Continue treatment for total of six weeks. -Perform surveillance blood cultures two weeks after completion of IV antibiotics --he has appt with Dr. Thedore Mins in early March as well.  Ampicillin Allergy Patient had a history of ampicillin allergy in childhood. Recent amoxicillin challenge was initially successful, but patient developed a rash after beign on IV  ampicillin and ceftriaxone for endocarditis. -Document ampicillin allergy in patient's medical record. -Advise patient to avoid ampicillin in the future.  Home Health Care Patient reports lack of communication from home health care nurse regarding scheduled visits for blood work. -Contact home health care agency to address communication issues and ensure patient receives necessary care.

## 2024-01-18 NOTE — Telephone Encounter (Signed)
Let's do 850 mg once daily for ~8.7 mg/kg - texting Pam about it now.

## 2024-01-19 ENCOUNTER — Other Ambulatory Visit: Payer: Self-pay

## 2024-01-19 ENCOUNTER — Ambulatory Visit: Payer: 59 | Admitting: Infectious Disease

## 2024-01-19 ENCOUNTER — Encounter: Payer: Self-pay | Admitting: Infectious Disease

## 2024-01-19 ENCOUNTER — Telehealth: Payer: Self-pay

## 2024-01-19 VITALS — BP 126/76 | HR 86 | Resp 16 | Ht 72.0 in | Wt 218.4 lb

## 2024-01-19 DIAGNOSIS — R21 Rash and other nonspecific skin eruption: Secondary | ICD-10-CM

## 2024-01-19 DIAGNOSIS — I7121 Aneurysm of the ascending aorta, without rupture: Secondary | ICD-10-CM

## 2024-01-19 DIAGNOSIS — R7881 Bacteremia: Secondary | ICD-10-CM

## 2024-01-19 DIAGNOSIS — I358 Other nonrheumatic aortic valve disorders: Secondary | ICD-10-CM

## 2024-01-19 DIAGNOSIS — L270 Generalized skin eruption due to drugs and medicaments taken internally: Secondary | ICD-10-CM

## 2024-01-19 DIAGNOSIS — A498 Other bacterial infections of unspecified site: Secondary | ICD-10-CM

## 2024-01-19 NOTE — Telephone Encounter (Signed)
Reached out to Jeri Modena to request Robert Madden nurse call patient. Was told he would get a nurse by Tuesday and has never heard from anyone. He just got switched from Rocephin/Ampicillin to Dapto IV. Pam will reach out to [facility].

## 2024-01-20 ENCOUNTER — Telehealth: Payer: Self-pay | Admitting: Pharmacist

## 2024-01-20 LAB — LAB REPORT - SCANNED
Calcium: 9
EGFR: 60

## 2024-01-20 NOTE — Telephone Encounter (Signed)
Perfect - shared with Amy - thanks!

## 2024-01-20 NOTE — Telephone Encounter (Signed)
Received email from Amy, [facility] pharmacist, stating patient's renal function increased from baseline ~0.9-1 to 1.35 yesterday. Has stopped ampicillin/ceftriaxone and started daptomycin. No dose adjustment required for now given CrCl still ~80 ml/min. CK WNL. Given this is a slight AKI, wondering if you'd recommend any fluid bolus? Let me know - I can respond back to Amy. Thanks!  Margarite Gouge, PharmD, CPP, BCIDP, AAHIVP Clinical Pharmacist Practitioner Infectious Diseases Clinical Pharmacist [facility]

## 2024-01-30 ENCOUNTER — Encounter: Payer: Self-pay | Admitting: Infectious Disease

## 2024-01-30 DIAGNOSIS — L270 Generalized skin eruption due to drugs and medicaments taken internally: Secondary | ICD-10-CM

## 2024-01-30 HISTORY — DX: Generalized skin eruption due to drugs and medicaments taken internally: L27.0

## 2024-01-31 ENCOUNTER — Inpatient Hospital Stay: Payer: Self-pay | Admitting: Infectious Disease

## 2024-01-31 DIAGNOSIS — I7121 Aneurysm of the ascending aorta, without rupture: Secondary | ICD-10-CM

## 2024-01-31 DIAGNOSIS — L270 Generalized skin eruption due to drugs and medicaments taken internally: Secondary | ICD-10-CM

## 2024-01-31 DIAGNOSIS — A498 Other bacterial infections of unspecified site: Secondary | ICD-10-CM

## 2024-01-31 DIAGNOSIS — I358 Other nonrheumatic aortic valve disorders: Secondary | ICD-10-CM

## 2024-02-09 ENCOUNTER — Encounter: Payer: Self-pay | Admitting: Infectious Disease

## 2024-02-15 ENCOUNTER — Telehealth: Payer: Self-pay

## 2024-02-15 NOTE — Telephone Encounter (Signed)
 Patient left voicemail requesting to go over lab results from last week. Will route to provider.   Sandie Ano, RN

## 2024-02-23 ENCOUNTER — Telehealth: Payer: Self-pay

## 2024-02-23 ENCOUNTER — Ambulatory Visit (HOSPITAL_COMMUNITY): Payer: 59 | Attending: Cardiology

## 2024-02-23 DIAGNOSIS — I330 Acute and subacute infective endocarditis: Secondary | ICD-10-CM

## 2024-02-23 DIAGNOSIS — I351 Nonrheumatic aortic (valve) insufficiency: Secondary | ICD-10-CM

## 2024-02-23 DIAGNOSIS — I38 Endocarditis, valve unspecified: Secondary | ICD-10-CM

## 2024-02-23 LAB — ECHOCARDIOGRAM COMPLETE
Area-P 1/2: 3.74 cm2
P 1/2 time: 453 ms
S' Lateral: 2.9 cm

## 2024-02-23 NOTE — Telephone Encounter (Signed)
 RE: PULL PICC Order  Status Received: Today Veryl Speak, FNP  Raegan Winders, Ellender Hose, RN; Ozella Rocks, Diminique T, CMA; Juanita Laster, RMA Cc: Danelle Earthly, MD After chart review no indication to leave PICC line in and can remove and follow up with Dr. Thedore Mins on 3/31.  Thanks.       Previous Messages    ----- Message ----- From: Sandie Ano, RN Sent: 02/23/2024  10:03 AM EDT To: Jeannette How, FNP; * Subject: RE: PULL PICC Order  Status                    Hi Pam, Dr. Thedore Mins is out this week and Tammy Sours is covering her so I'll add him here.  Thanks! ----- Message ----- From: Sedalia Muta Sent: 02/23/2024   9:56 AM EDT To: Juanita Laster, RMA; Sandie Ano, RN; * Subject: PULL PICC Order  Status                        Hi team. Pt finished IVABX on 02/19/24. OPAT noted to leave PICC until seen by provider.  RN reaching out on plan/status to pull PICC. Please advise. Thanks!

## 2024-02-28 ENCOUNTER — Other Ambulatory Visit: Payer: Self-pay

## 2024-02-28 ENCOUNTER — Encounter: Payer: Self-pay | Admitting: Internal Medicine

## 2024-02-28 ENCOUNTER — Ambulatory Visit: Payer: 59 | Admitting: Internal Medicine

## 2024-02-28 VITALS — BP 134/78 | HR 68 | Resp 16 | Ht 72.0 in | Wt 222.0 lb

## 2024-02-28 DIAGNOSIS — Z1211 Encounter for screening for malignant neoplasm of colon: Secondary | ICD-10-CM | POA: Diagnosis not present

## 2024-02-28 DIAGNOSIS — I358 Other nonrheumatic aortic valve disorders: Secondary | ICD-10-CM

## 2024-02-28 NOTE — Progress Notes (Signed)
 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 He had childhood allergy to PCN but tolerated amoxicillin challenge and was placed on dual beta lactam therapy with continuous IV ampicillin + ceftriaxone 2 grams IV q 12 hours but then developed a rash within the last 3 days  and was switched to daptomycin.   Discussed the use of AI scribe software for clinical note transcription with the patient, who gave verbal consent to proceed.   History of Present Illness   The patient, with a history of heart valve infection (aortic valve), presents with a rash. He was previously treated with IV ampicillin and ceftriaxone for the infection. However, he developed a rash, which he believes is due to an allergic reaction to ampicillin. The rash is not itchy, but it is widespread, covering his back, chest, and face. The rash started a couple of days ago, initially on his back, and has since spread. He has a history of penicillin allergy from childhood, which was thought to  have resolved when he had no problems with the amoxicillin challenge.. He has changed to daptomycin, and the previous antibiotics were stopped yesterday. He also has a PICC line in place, which appears to be in good condition. He also has a history of aortic aneurysm, which is being monitored.    "  Review of Systems  All other systems reviewed and are negative.   Past Medical History:  Diagnosis Date   Aortic insufficiency    Drug rash 01/30/2024   Enterococcus faecalis infection 01/18/2024   Hyperlipidemia    IBS (irritable bowel syndrome)    Rash 01/18/2024   Tachycardia, paroxysmal (HCC)     Outpatient Medications Prior to Visit  Medication Sig Dispense Refill   ibuprofen (ADVIL) 200 MG tablet Take 200 mg by mouth every 4 (four) hours as needed for headache or mild pain (pain score 1-3).     metoprolol succinate (TOPROL-XL) 50 MG 24 hr tablet Take 1 tablet (50 mg total) by mouth daily. 90 tablet 3   pravastatin (PRAVACHOL) 40 MG tablet   12   daptomycin (CUBICIN) IVPB Inject 850 mg into the vein daily. (Patient not taking: Reported on 02/28/2024)     No facility-administered medications prior to visit.     Allergies  Allergen Reactions   Ampicillin Rash    Pt describes itchy rash in 4th grade.  No throat swelling or breathing problems.  He then had amoxicillin challenge but developed fulll body rash on high dose ampicillin + ceftriaxone   Atorvastatin     Other reaction(s): joint aches  Simvastatin Other (See Comments)    Joint aches   Honey Rash    Social History   Tobacco Use   Smoking status: Never   Smokeless tobacco: Never  Vaping Use   Vaping status: Never Used  Substance Use Topics   Alcohol use: No   Drug use: No    Family History  Problem Relation Age of Onset   Hypertension Mother    Heart attack Neg Hx    Stroke Neg Hx     Objective:   Vitals:   02/28/24 1121  BP: 134/78  Pulse: 68  Resp: 16  SpO2: 97%  Weight: 222 lb (100.7 kg)   Height: 6' (1.829 m)   Body mass index is 30.11 kg/m.  Physical Exam Constitutional:      General: He is not in acute distress.    Appearance: He is normal weight. He is not toxic-appearing.  HENT:     Head: Normocephalic and atraumatic.     Right Ear: External ear normal.     Left Ear: External ear normal.     Nose: No congestion or rhinorrhea.     Mouth/Throat:     Mouth: Mucous membranes are moist.     Pharynx: Oropharynx is clear.  Eyes:     Extraocular Movements: Extraocular movements intact.     Conjunctiva/sclera: Conjunctivae normal.     Pupils: Pupils are equal, round, and reactive to light.  Cardiovascular:     Rate and Rhythm: Normal rate and regular rhythm.     Heart sounds: No murmur heard.    No friction rub. No gallop.  Pulmonary:     Effort: Pulmonary effort is normal.     Breath sounds: Normal breath sounds.  Abdominal:     General: Abdomen is flat. Bowel sounds are normal.     Palpations: Abdomen is soft.  Musculoskeletal:        General: No swelling. Normal range of motion.     Cervical back: Normal range of motion and neck supple.  Skin:    General: Skin is warm and dry.  Neurological:     General: No focal deficit present.     Mental Status: He is oriented to person, place, and time.  Psychiatric:        Mood and Affect: Mood normal.     Lab Results: Lab Results  Component Value Date   WBC 5.0 01/11/2024   HGB 11.6 (L) 01/11/2024   HCT 34.9 (L) 01/11/2024   MCV 87.9 01/11/2024   PLT 222 01/11/2024    Lab Results  Component Value Date   CREATININE 0.96 01/11/2024   BUN 17 01/11/2024   NA 137 01/11/2024   K 4.0 01/11/2024   CL 104 01/11/2024   CO2 22 01/11/2024    Lab Results  Component Value Date   ALT 51 (H) 01/11/2024   AST 27 01/11/2024   ALKPHOS 55 01/11/2024   BILITOT 0.6 01/11/2024     Assessment & Plan:   63 year old male presented to [facility] with referral for FUO found to have positive blood cultures admitted with: #E  faecalis bacteremia with native aortic valve endocarditis #CNS infarction #History of evaluation #Thoracic aortic aneurysm #Elevated LFTs - Patient has been having symptoms of fever and bloody vision for few weeks. Possible hx of paronychia. No GI symptoms piror to hospitalization. - Blood cultures  grew E faecalis.  Patient started on vancomycin. - TTE showed concern for mobile mass on native aortic valve, TEE showed  -  MRI brain 9 showed signal abnormality tiny acute to subacute ischemic infarct in high left frontal temporal lobe.  I agree this is more consistent with infarct rather than emboli -TEE showed small echodensity on the noncoronary cusp of aortic valve measuring 0.5 cm concerning vegetation, moderate AI #Ampicillin allergy #Back Rash -Also there is some concern of a rash that developed after vancomycin on his back which is pruritic following first dose of antibiotics to admission. Does not seem  typical of drug rash. Improved at last visit. Tolerated amp challenge. Then developed rash on amp + ctx. Transitioned to daptomycin to complete abx x 6 weeks  -TTE on 3/26 showed "persistence of a mobile density on the noncoronary cusp  concerning for vegetation " -Completed abx on 3/22. No HA and fever.  -3/19 labs stable, picc pulled Plan -Sees card on thursday, may need to reschedule. Will hold off further abx and get blood Cx with labs. I suspect the AoV density is sterile -Refer GI for colonoscopy -F/U in one month  Danelle Earthly, MD [facility]   02/28/24  11:24 AM I have personally spent 45 minutes involved in face-to-face and non-face-to-face activities for this patient on the day of the visit. Professional time spent includes the following activities: Preparing to see the patient (review of tests), Obtaining and/or reviewing separately obtained history (admission/discharge record), Performing a medically appropriate  examination and/or evaluation , Ordering medications/tests/procedures, referring and communicating with other health care professionals, Documenting clinical information in the EMR, Independently interpreting results (not separately reported), Communicating results to the patient/family/caregiver, Counseling and educating the patient/family/caregiver and Care coordination (not separately reported).

## 2024-02-29 ENCOUNTER — Encounter: Payer: Self-pay | Admitting: Gastroenterology

## 2024-03-01 NOTE — Progress Notes (Unsigned)
 Cardiology Office Note:  .   Date:  03/02/2024  ID:  Robert Madden, DOB Oct 29, 1961, MRN [id number] PCP: Asencion Gowda.[name], MD (Inactive)  [facility] Providers Cardiologist:  None Electrophysiologist:  Lewayne Bunting, MD {  History of Present Illness: Robert Madden   Robert Madden is a 63 y.o. male with a past medical history of IBS, HLD, HTN, aortic root dilation, aortic aneurysm and SVT here for follow-up appointment.  He was seen in the clinic by EP 05/25/2023.  He reported that he was doing well at that time.  He was fasting intermittently and had lost some weight.  He was down about 15 pounds over the last year.  He walks, uses a recumbent bike and treadmill.  Occasionally sensation of a fast heartbeat.  Resolves quickly.  Today, he presents with a history of AV endocarditis, supraventricular tachycardia (SVT), and aortic aneurysm, for a follow-up visit. The patient was hospitalized in February for endocarditis and underwent a course of antibiotics. Despite treatment, a recent echocardiogram revealed a persistent mobile density on the aortic valve, suggestive of vegetation. The patient also showed a progression of aortic insufficiency from mild to moderate. The patient reports no symptoms related to these conditions. The patient's cholesterol level is slightly elevated, but the patient has been off pravastatin for a couple of months. The patient's recent blood cultures are negative (day three of cultures).  Reports no shortness of breath nor dyspnea on exertion. Reports no chest pain, pressure, or tightness. No edema, orthopnea, PND. Reports no palpitations.   Discussed the use of AI scribe software for clinical note transcription with the patient, who gave verbal consent to proceed.   ROS: pertinent ROS in HPI  Studies Reviewed: .       Echo 02/23/24     ECHOCARDIOGRAM REPORT       Patient Name:   Robert Madden Date of Exam: 02/23/2024 Medical Rec #:  [id number]         Height:        72.0 in Accession #:    [id number]        Weight:       218.4 lb Date of Birth:  [birth year]         BSA:          2.212 m Patient Age:    62 years          BP:           126/76 mmHg Patient Gender: M                 HR:           68 bpm. Exam Location:  [address]  Procedure: 2D Echo, 3D Echo, Cardiac Doppler, Color Doppler and Strain Analysis            (Both Spectral and Color Flow Doppler were utilized during            procedure).  Indications:    I38 Endocarditis   History:        Patient has prior history of Echocardiogram examinations, most                 recent 01/07/2024. Endocarditis.   Sonographer:    Samule Ohm RDCS Referring Phys: [id number] SHENG L HALEY  IMPRESSIONS    1. Left ventricular ejection fraction, by estimation, is 60 to 65%. Left ventricular ejection fraction by 3D volume is 60 %. The left ventricle has normal function. The  left ventricle has no regional wall motion abnormalities. There is severe left ventricular hypertrophy of the basal-septal segment. Left ventricular diastolic parameters are indeterminate. The average left ventricular global longitudinal strain is -20.9 %. The global longitudinal strain is normal.  2. Right ventricular systolic function is normal. The right ventricular size is normal.  3. The mitral valve is normal in structure. Trivial mitral valve regurgitation. No evidence of mitral stenosis.  4. The aortic valve is abnormal     There is a persistence of a mobile density on the noncoronary cusp concerning for vegetation. Aortic valve regurgitation is moderate.  5. Aortic dilatation noted. There is mild dilatation of the aortic root, measuring 44 mm. There is mild dilatation of the ascending aorta, measuring 42 mm.  6. The inferior vena cava is normal in size with greater than 50% respiratory variability, suggesting right atrial pressure of 3 mmHg.  FINDINGS  Left Ventricle: Left ventricular ejection fraction, by  estimation, is 60 to 65%. Left ventricular ejection fraction by 3D volume is 60 %. The left ventricle has normal function. The left ventricle has no regional wall motion abnormalities. The average left ventricular global longitudinal strain is -20.9 %. Strain was performed and the global longitudinal strain is normal. The left ventricular internal cavity size was normal in size. There is severe left ventricular hypertrophy of the basal-septal segment. Left ventricular diastolic parameters are indeterminate. Normal left ventricular filling pressure.  Right Ventricle: The right ventricular size is normal. No increase in right ventricular wall thickness. Right ventricular systolic function is normal.  Left Atrium: Left atrial size was normal in size.  Right Atrium: Right atrial size was normal in size.  Pericardium: There is no evidence of pericardial effusion.  Mitral Valve: The mitral valve is normal in structure. Trivial mitral valve regurgitation. No evidence of mitral valve stenosis.  Tricuspid Valve: The tricuspid valve is normal in structure. Tricuspid valve regurgitation is trivial. No evidence of tricuspid stenosis.  Aortic Valve: There is a persistence of a mobile density on the noncoronary cusp concerning for vegetation. The aortic valve is abnormal. Aortic valve regurgitation is moderate. Aortic regurgitation PHT measures 453 msec. No aortic stenosis is present.  Pulmonic Valve: The pulmonic valve was normal in structure. Pulmonic valve regurgitation is trivial. No evidence of pulmonic stenosis.  Aorta: Aortic dilatation noted. There is mild dilatation of the aortic root, measuring 44 mm. There is mild dilatation of the ascending aorta, measuring 42 mm.  Venous: The inferior vena cava is normal in size with greater than 50% respiratory variability, suggesting right atrial pressure of 3 mmHg.  IAS/Shunts: No atrial level shunt detected by color flow  Doppler.  Additional Comments: 3D was performed not requiring image post processing on an independent workstation and was normal.       Physical Exam:   VS:  BP 118/66   Pulse 82   Ht 6' (1.829 m)   Wt 221 lb 12.8 oz (100.6 kg)   SpO2 98%   BMI 30.08 kg/m    Wt Readings from Last 3 Encounters:  03/02/24 221 lb 12.8 oz (100.6 kg)  02/28/24 222 lb (100.7 kg)  01/19/24 218 lb 6.4 oz (99.1 kg)    GEN: Well nourished, well developed in no acute distress NECK: No JVD; No carotid bruits CARDIAC: RRR, no murmurs, rubs, gallops RESPIRATORY:  Clear to auscultation without rales, wheezing or rhonchi  ABDOMEN: Soft, non-tender, non-distended EXTREMITIES:  No edema; No deformity   ASSESSMENT AND PLAN: .  Aortic valve endocarditis Persistent mobile density on aortic valve suggests vegetation despite prior IV antibiotics. Moderate aortic regurgitation and mild aortic dilation present. Blood cultures negative, indicating no active infection. Surgical intervention may be necessary if antibiotics fail. - Administer prophylactic antibiotics before dental procedures. - Order TEE to evaluate aortic valve and vegetation.  Aortic aneurysm Mild dilation at 45 mm, routine follow-up every two years.  Supraventricular tachycardia (SVT) No recent episodes, well-controlled with metoprolol. - Continue metoprolol as prescribed.  Hyperlipidemia LDL elevated at 144 mg/dL due to inconsistent pravastatin use during hospitalization. - Recheck lipid panel in 3 months after consistent pravastatin use. - Continue pravastatin 40 mg as prescribed.     Informed Consent   Shared Decision Making/Informed Consent   The risks [esophageal damage, perforation (1:10,000 risk), bleeding, pharyngeal hematoma as well as other potential complications associated with conscious sedation including aspiration, arrhythmia, respiratory failure and death], benefits (treatment guidance and diagnostic support) and alternatives  of a transesophageal echocardiogram were discussed in detail with Mr. Spagnolo and he is willing to proceed.      Dispo: Follow-up with Dr. Ladona Ridgel  Signed, Sharlene Dory, PA-C

## 2024-03-02 ENCOUNTER — Ambulatory Visit: Payer: 59 | Attending: Physician Assistant | Admitting: Physician Assistant

## 2024-03-02 ENCOUNTER — Encounter: Payer: Self-pay | Admitting: Physician Assistant

## 2024-03-02 VITALS — BP 118/66 | HR 82 | Ht 72.0 in | Wt 221.8 lb

## 2024-03-02 DIAGNOSIS — E785 Hyperlipidemia, unspecified: Secondary | ICD-10-CM | POA: Diagnosis not present

## 2024-03-02 DIAGNOSIS — I471 Supraventricular tachycardia, unspecified: Secondary | ICD-10-CM

## 2024-03-02 DIAGNOSIS — Z01812 Encounter for preprocedural laboratory examination: Secondary | ICD-10-CM

## 2024-03-02 DIAGNOSIS — I330 Acute and subacute infective endocarditis: Secondary | ICD-10-CM

## 2024-03-02 DIAGNOSIS — I7121 Aneurysm of the ascending aorta, without rupture: Secondary | ICD-10-CM

## 2024-03-02 MED ORDER — PRAVASTATIN SODIUM 40 MG PO TABS: 40.0000 mg | ORAL_TABLET | Freq: Every day | ORAL | 3 refills | Status: DC

## 2024-03-02 MED ORDER — AZITHROMYCIN 500 MG PO TABS: 500.0000 mg | ORAL_TABLET | Freq: Once | ORAL | 0 refills | Status: AC

## 2024-03-02 MED ORDER — METOPROLOL SUCCINATE ER 50 MG PO TB24: 50.0000 mg | ORAL_TABLET | Freq: Every day | ORAL | 3 refills | Status: AC

## 2024-03-02 NOTE — Patient Instructions (Addendum)
 Medication Instructions:   Discussed the use of prophylactic antibiotics before dental work and other surgeries. Prescription given for  Azithromycin 500 mg orally one hour before procedure.   *If you need a refill on your cardiac medications before your next appointment, please call your pharmacy*   Testing/Procedures:  Your physician has requested that you have a TEE. During a TEE, sound waves are used to create images of your heart. It provides your doctor with information about the size and shape of your heart and how well your heart's chambers and valves are working. In this test, a transducer is attached to the end of a flexible tube that's guided down your throat and into your esophagus (the tube leading from you mouth to your stomach) to get a more detailed image of your heart. You are not awake for the procedure. Please see the instruction sheet given to you today. For further information please visit [email or web address]   Follow-Up:  AT NEXT AVAILABLE APPOINTMENT WITH DR. Court Joy are scheduled for a TEE (Transesophageal Echocardiogram) on Tuesday, April 8 with Dr. Servando Salina.  Please arrive at the [facility] (Main Entrance A) at [facility]: [address] [place], [place] [address] at 10:30 AM (This time is 1 hour(s) before your procedure to ensure your preparation).   Free valet parking service is available. You will check in at ADMITTING.   *Please Note: You will receive a call the day before your procedure to confirm the appointment time. That time may have changed from the original time based on the schedule for that day.*    DIET:  Nothing to eat or drink after midnight except a sip of water with medications (see medication instructions below)  MEDICATION INSTRUCTIONS: !!IF ANY NEW MEDICATIONS ARE STARTED AFTER TODAY, PLEASE NOTIFY YOUR PROVIDER AS SOON AS POSSIBLE!!  FYI: Medications such as Semaglutide (Ozempic, [place]), Tirzepatide (Mounjaro, Zepbound),  Dulaglutide (Trulicity), etc ("GLP1 agonists") AND Canagliflozin (Invokana), Dapagliflozin (Farxiga), Empagliflozin (Jardiance), Ertugliflozin (Steglatro), Bexagliflozin [employer]) or any combination with one of these drugs such as Invokamet (Canagliflozin/Metformin), Synjardy (Empagliflozin/Metformin), etc ("SGLT2 inhibitors") must be held around the time of a procedure. This is not a comprehensive list of all of these drugs. Please review all of your medications and talk to your provider if you take any one of these. If you are not sure, ask your provider.   LABS: NONE  FYI:  For your safety, and to allow [place] to monitor your vital signs accurately during the surgery/procedure we request: If you have artificial nails, gel coating, SNS etc, please have those removed prior to your surgery/procedure. Not having the nail coverings /polish removed may result in cancellation or delay of your surgery/procedure.  Your support person will be asked to wait in the waiting room during your procedure.  It is OK to have someone drop you off and come back when you are ready to be discharged.  You cannot drive after the procedure and will need someone to drive you home.  Bring your insurance cards.  *Special Note: Every effort is made to have your procedure done on time. Occasionally there are emergencies that occur at the hospital that may cause delays. Please be patient if a delay does occur.          1st Floor: - Lobby - Registration  - Pharmacy  - Lab - Cafe  2nd Floor: - PV Lab - Diagnostic Testing (echo, CT, nuclear med)  3rd Floor: - Vacant  4th Floor: - TCTS (cardiothoracic surgery) - AFib Clinic - Structural Heart Clinic - Vascular Surgery  - Vascular Ultrasound  5th Floor: - [facility] (general and EP) - Clinical Pharmacy for coumadin, hypertension, lipid, weight-loss medications, and med management appointments    Valet parking services will be available as well.

## 2024-03-04 LAB — CULTURE, BLOOD (SINGLE)
MICRO NUMBER:: 16268719
MICRO NUMBER:: 16268720
Result:: NO GROWTH
Result:: NO GROWTH
SPECIMEN QUALITY:: ADEQUATE
SPECIMEN QUALITY:: ADEQUATE

## 2024-03-04 LAB — COMPLETE METABOLIC PANEL WITHOUT GFR
AG Ratio: 1.4 (calc) (ref 1.0–2.5)
ALT: 52 U/L — ABNORMAL HIGH (ref 9–46)
AST: 29 U/L (ref 10–35)
Albumin: 4.6 g/dL (ref 3.6–5.1)
Alkaline phosphatase (APISO): 46 U/L (ref 35–144)
BUN: 13 mg/dL (ref 7–25)
CO2: 25 mmol/L (ref 20–32)
Calcium: 9.7 mg/dL (ref 8.6–10.3)
Chloride: 102 mmol/L (ref 98–110)
Creat: 1.13 mg/dL (ref 0.70–1.35)
Globulin: 3.3 g/dL (ref 1.9–3.7)
Glucose, Bld: 89 mg/dL (ref 65–99)
Potassium: 4.8 mmol/L (ref 3.5–5.3)
Sodium: 136 mmol/L (ref 135–146)
Total Bilirubin: 0.6 mg/dL (ref 0.2–1.2)
Total Protein: 7.9 g/dL (ref 6.1–8.1)

## 2024-03-04 LAB — CBC WITH DIFFERENTIAL/PLATELET
Absolute Lymphocytes: 1464 {cells}/uL (ref 850–3900)
Absolute Monocytes: 374 {cells}/uL (ref 200–950)
Basophils Absolute: 38 {cells}/uL (ref 0–200)
Basophils Relative: 0.8 %
Eosinophils Absolute: 110 {cells}/uL (ref 15–500)
Eosinophils Relative: 2.3 %
HCT: 44.5 % (ref 38.5–50.0)
Hemoglobin: 14.8 g/dL (ref 13.2–17.1)
MCH: 28.8 pg (ref 27.0–33.0)
MCHC: 33.3 g/dL (ref 32.0–36.0)
MCV: 86.6 fL (ref 80.0–100.0)
MPV: 9.2 fL (ref 7.5–12.5)
Monocytes Relative: 7.8 %
Neutro Abs: 2813 {cells}/uL (ref 1500–7800)
Neutrophils Relative %: 58.6 %
Platelets: 224 10*3/uL (ref 140–400)
RBC: 5.14 10*6/uL (ref 4.20–5.80)
RDW: 13.7 % (ref 11.0–15.0)
Total Lymphocyte: 30.5 %
WBC: 4.8 10*3/uL (ref 3.8–10.8)

## 2024-03-04 LAB — C-REACTIVE PROTEIN: CRP: <3 mg/L (ref <8.0)

## 2024-03-04 LAB — SEDIMENTATION RATE: Sed Rate: 11 mm/h (ref 0–20)

## 2024-03-06 ENCOUNTER — Other Ambulatory Visit: Payer: Self-pay | Admitting: Physician Assistant

## 2024-03-06 NOTE — Progress Notes (Signed)
 Orders already placed.  Sharlene Dory, PA-C

## 2024-03-06 NOTE — H&P (View-Only) (Signed)
 Orders already placed.  Sharlene Dory, PA-C

## 2024-03-06 NOTE — Progress Notes (Signed)
 Called patient with pre-procedure instructions for tomorrow.   Patient informed of:   Time to arrive for procedure. 1000 Remain NPO past midnight.  Must have a ride home and a responsible adult to remain with them for 24 hours post procedure.  Confirmed blood thinner. Confirmed no breaks in taking blood thinner for 3+ weeks prior to procedure. Confirmed patient stopped all GLP-1s and GLP-2s for at least one week before procedure.

## 2024-03-07 ENCOUNTER — Ambulatory Visit (HOSPITAL_BASED_OUTPATIENT_CLINIC_OR_DEPARTMENT_OTHER)
Admission: RE | Admit: 2024-03-07 | Discharge: 2024-03-07 | Disposition: A | Discharge status: Home / Self Care | Source: Ambulatory Visit | Attending: Cardiology | Admitting: Cardiology

## 2024-03-07 ENCOUNTER — Telehealth: Payer: Self-pay | Admitting: *Deleted

## 2024-03-07 ENCOUNTER — Ambulatory Visit (HOSPITAL_COMMUNITY)
Admission: RE | Admit: 2024-03-07 | Discharge: 2024-03-07 | Disposition: A | Discharge status: Home / Self Care | Attending: Cardiology | Admitting: Cardiology

## 2024-03-07 ENCOUNTER — Ambulatory Visit (HOSPITAL_COMMUNITY): Admitting: Anesthesiology

## 2024-03-07 ENCOUNTER — Encounter (HOSPITAL_COMMUNITY)
Admission: RE | Disposition: A | Discharge status: Home / Self Care | Payer: Self-pay | Source: Home / Self Care | Attending: Cardiology

## 2024-03-07 ENCOUNTER — Encounter (HOSPITAL_COMMUNITY): Payer: Self-pay | Admitting: Cardiology

## 2024-03-07 ENCOUNTER — Other Ambulatory Visit: Payer: Self-pay

## 2024-03-07 ENCOUNTER — Ambulatory Visit (HOSPITAL_BASED_OUTPATIENT_CLINIC_OR_DEPARTMENT_OTHER): Admitting: Anesthesiology

## 2024-03-07 DIAGNOSIS — Q2543 Congenital aneurysm of aorta: Secondary | ICD-10-CM | POA: Diagnosis not present

## 2024-03-07 DIAGNOSIS — I38 Endocarditis, valve unspecified: Secondary | ICD-10-CM | POA: Diagnosis present

## 2024-03-07 DIAGNOSIS — I340 Nonrheumatic mitral (valve) insufficiency: Secondary | ICD-10-CM | POA: Diagnosis not present

## 2024-03-07 DIAGNOSIS — K589 Irritable bowel syndrome without diarrhea: Secondary | ICD-10-CM | POA: Insufficient documentation

## 2024-03-07 DIAGNOSIS — I471 Supraventricular tachycardia, unspecified: Secondary | ICD-10-CM | POA: Diagnosis not present

## 2024-03-07 DIAGNOSIS — I080 Rheumatic disorders of both mitral and aortic valves: Secondary | ICD-10-CM | POA: Insufficient documentation

## 2024-03-07 DIAGNOSIS — I10 Essential (primary) hypertension: Secondary | ICD-10-CM | POA: Insufficient documentation

## 2024-03-07 DIAGNOSIS — I351 Nonrheumatic aortic (valve) insufficiency: Secondary | ICD-10-CM

## 2024-03-07 DIAGNOSIS — Z79899 Other long term (current) drug therapy: Secondary | ICD-10-CM | POA: Insufficient documentation

## 2024-03-07 DIAGNOSIS — E785 Hyperlipidemia, unspecified: Secondary | ICD-10-CM | POA: Insufficient documentation

## 2024-03-07 HISTORY — PX: TRANSESOPHAGEAL ECHOCARDIOGRAM (CATH LAB): EP1270

## 2024-03-07 LAB — ECHO TEE

## 2024-03-07 MED ORDER — SODIUM CHLORIDE 0.9 % IV SOLN: INTRAVENOUS | Status: DC

## 2024-03-07 MED ORDER — SODIUM CHLORIDE 0.9 % IV SOLN: INTRAVENOUS | Status: DC | PRN

## 2024-03-07 MED ORDER — PROPOFOL 500 MG/50ML IV EMUL
INTRAVENOUS | Status: DC | PRN
  Administered 2024-03-07: 150 ug/kg/min via INTRAVENOUS

## 2024-03-07 MED ORDER — ONDANSETRON HCL 4 MG/2ML IJ SOLN: 4.0000 mg | Freq: Once | INTRAMUSCULAR | Status: DC | PRN

## 2024-03-07 MED ORDER — AMISULPRIDE (ANTIEMETIC) 5 MG/2ML IV SOLN: 10.0000 mg | Freq: Once | INTRAVENOUS | Status: DC | PRN

## 2024-03-07 MED ORDER — PROPOFOL 10 MG/ML IV BOLUS
INTRAVENOUS | Status: DC | PRN
  Administered 2024-03-07: 80 mg via INTRAVENOUS
  Administered 2024-03-07: 20 mg via INTRAVENOUS

## 2024-03-07 NOTE — Interval H&P Note (Signed)
 History and Physical Interval Note:  03/07/2024 10:40 AM  Robert Madden  has presented today for surgery, with the diagnosis of ENDOCARDITIS.  The various methods of treatment have been discussed with the patient and family. After consideration of risks, benefits and other options for treatment, the patient has consented to  Procedure(s): TRANSESOPHAGEAL ECHOCARDIOGRAM (N/A) as a surgical intervention.  The patient's history has been reviewed, patient examined, no change in status, stable for surgery.  I have reviewed the patient's chart and labs.  Questions were answered to the patient's satisfaction.     Rakeya Glab

## 2024-03-07 NOTE — Transfer of Care (Signed)
 Immediate Anesthesia Transfer of Care Note  Patient: Robert Madden  Procedure(s) Performed: TRANSESOPHAGEAL ECHOCARDIOGRAM  Patient Location: Cath Lab  Anesthesia Type:MAC  Level of Consciousness: awake, alert , and oriented  Airway & Oxygen Therapy: Patient Spontanous Breathing and Patient connected to nasal cannula oxygen  Post-op Assessment: Report given to RN and Post -op Vital signs reviewed and stable  Post vital signs: Reviewed and stable  Last Vitals:  Vitals Value Taken Time  BP 122/84 1115  Temp    Pulse 80 1115  Resp 20 1115  SpO2 97 1115    Last Pain:  Vitals:   03/07/24 1016  TempSrc:   PainSc: 0-No pain         Complications: No notable events documented.

## 2024-03-07 NOTE — Anesthesia Preprocedure Evaluation (Signed)
 Anesthesia Evaluation  Patient identified by MRN, date of birth, ID band Patient awake    Reviewed: Allergy & Precautions, NPO status , Patient's Chart, lab work & pertinent test results  Airway Mallampati: II  TM Distance: >3 FB Neck ROM: Full    Dental   Pulmonary neg pulmonary ROS   Pulmonary exam normal        Cardiovascular hypertension, Pt. on home beta blockers and Pt. on medications Normal cardiovascular exam     Neuro/Psych negative neurological ROS     GI/Hepatic negative GI ROS, Neg liver ROS,,,  Endo/Other  negative endocrine ROS    Renal/GU negative Renal ROS     Musculoskeletal   Abdominal   Peds  Hematology  (+) Blood dyscrasia, anemia   Anesthesia Other Findings   Reproductive/Obstetrics                             Anesthesia Physical Anesthesia Plan  ASA: 3  Anesthesia Plan: MAC   Post-op Pain Management: Minimal or no pain anticipated   Induction: Intravenous  PONV Risk Score and Plan: 1 and Propofol infusion  Airway Management Planned: Natural Airway and Nasal Cannula  Additional Equipment: None  Intra-op Plan:   Post-operative Plan:   Informed Consent: I have reviewed the patients History and Physical, chart, labs and discussed the procedure including the risks, benefits and alternatives for the proposed anesthesia with the patient or authorized representative who has indicated his/her understanding and acceptance.       Plan Discussed with: CRNA  Anesthesia Plan Comments:        Anesthesia Quick Evaluation

## 2024-03-07 NOTE — Anesthesia Postprocedure Evaluation (Signed)
 Anesthesia Post Note  Patient: Robert Madden  Procedure(s) Performed: TRANSESOPHAGEAL ECHOCARDIOGRAM     Patient location during evaluation: PACU Anesthesia Type: MAC Level of consciousness: awake and alert Pain management: pain level controlled Vital Signs Assessment: post-procedure vital signs reviewed and stable Respiratory status: spontaneous breathing, nonlabored ventilation, respiratory function stable and patient connected to nasal cannula oxygen Cardiovascular status: stable and blood pressure returned to baseline Postop Assessment: no apparent nausea or vomiting Anesthetic complications: no  There were no known notable events for this encounter.  Last Vitals:  Vitals:   03/07/24 1145 03/07/24 1200  BP: 125/86 139/86  Pulse: 73 65  Resp: 12   Temp:    SpO2: 97% 97%    Last Pain:  Vitals:   03/07/24 1145  TempSrc:   PainSc: 0-No pain                 Shelton Silvas

## 2024-03-07 NOTE — Telephone Encounter (Signed)
-----   Message from Vance H sent at 03/03/2024  4:44 PM EDT ----- Regarding: RE: FIRST AVAILABLE WITH DR. Ladona Ridgel PER TESSA CONTE PA-C Patient is scheduled on 4/18 with Dr. Ladona Ridgel!!   #Here is the pool for EP - "CVD-EP Scheduling". Trying to get everyone to send #anything# for EP to this pool so it doesn't clog up the other scheduled pools! Spread that around :)   Cassie ----- Message ----- From: Loa Socks, LPN Sent: 08/05/453   4:54 PM EDT To: Ronn Melena; Angeline S Hammer; Garald Braver; # Subject: FIRST AVAILABLE WITH DR. Magdalene Patricia TESSA CO#  Jari Favre PA-C saw this pt in clinic today and wants him to get his follow-up appt scheduled with Dr. Ladona Ridgel at next available  Can you please call the pt and arrange this appt and please send me the date so I can endorse to [facility] thereafter?  He is aware you will be calling him   Thanks, Lajoyce Corners

## 2024-03-07 NOTE — CV Procedure (Signed)
    TRANSESOPHAGEAL ECHOCARDIOGRAM   NAME:  Robert Madden    MRN: [id number] DOB:  03/17/1961    ADMIT DATE: 03/07/2024  INDICATIONS: Endocarditis   PROCEDURE:   Informed consent was obtained prior to the procedure. The risks, benefits and alternatives for the procedure were discussed and the patient comprehended these risks.  Risks include, but are not limited to, cough, sore throat, vomiting, nausea, somnolence, esophageal and stomach trauma or perforation, bleeding, low blood pressure, aspiration, pneumonia, infection, trauma to the teeth and death.    Procedural time out performed. The oropharynx was anesthetized with viscous lidocaine.  Anesthesia was administered by the anaesthesilogy team to achieve and maintain moderate to deep conscious sedation.  The patient's heart rate, blood pressure, and oxygen saturation were monitored continuously during the procedure.  The transesophageal probe was inserted in the esophagus and stomach without difficulty and multiple views were obtained.   The patient tolerated the procedure well.  COMPLICATIONS:    There were no immediate complications.  KEY FINDINGS:  Normal EF, dilatation of the aortic root and proximal ascending aorta, moderate-severe aortic regurgitation. Noted small echodensity on the coronary cusp -seen on previous TEE Full report to follow. Further management per primary team.   Thomasene Ripple, DO [facility]  [facility]  11:29 AM

## 2024-03-07 NOTE — Progress Notes (Signed)
 Echocardiogram Echocardiogram Transesophageal has been performed.  Robert Madden 03/07/2024, 11:24 AM

## 2024-03-10 ENCOUNTER — Telehealth: Payer: Self-pay | Admitting: Physician Assistant

## 2024-03-10 NOTE — Telephone Encounter (Signed)
 Pt is requesting a callback to see if he should have his labs done or not. Please advise

## 2024-03-10 NOTE — Telephone Encounter (Signed)
 Pt saw Jari Favre PA-C last week where TEE and pre-procedure labs were ordered at that visit.   When resource nurse was scheduling the TEE (on 4/8) for the pt,  she noted he had recent labs done in a previous hospital visit on 3/31, which cleared him from getting any additional labs done for his TEE on 4/8.   Pt states [facility] has been sending him notifications stating he is due for labwork, and he just wanted to confirm that he still doesn't need any labs done.   Confirmed and reiterated to the pt that he indeed does NOT need any labs done at this moment and to disregard if [facility] is sending him notifications on our behalf stating he does.   Pt verbalized understanding and agrees with this plan.

## 2024-03-15 ENCOUNTER — Telehealth: Payer: Self-pay

## 2024-03-15 NOTE — Telephone Encounter (Signed)
 Patient is scheduled for a screening colonoscopy on 03/30/24 and PV on 03/22/24.  Endocarditis with echo on 02/23/24 and TEE on 03/07/24.  Please review and advise if pt is ok to proceed with colon at [facility] on 5/1. Thank you, Ilay Capshaw

## 2024-03-15 NOTE — Telephone Encounter (Signed)
 TC to patient, VM obtained and message left to please call back regarding his upcoming procedure (see J Nulty's note).  Instructed to ask for a PV nurse

## 2024-03-16 ENCOUNTER — Encounter: Payer: Self-pay | Admitting: Internal Medicine

## 2024-03-16 NOTE — Telephone Encounter (Signed)
 TC placed to patient.  He was informed per anesthesia:  Robert Madden,  This pt's elective procedure will need to be postponed until after the issues regarding his aortic valve have been resolved.  Thanks,  Cathryn Cobb   Patient has an appointment with his cardiologist tomorrow.  He was instructed to let the cardiologist know his elective colonoscopy was canceled d/t AV endocarditis.  It was recommended he obtain cardiac clearance.    Robert Madden and Dr. Tita Form, Please review cardiology notes from his visit tomorrow and advise if he needs to be done at the hospital (I noticed you changed his dot to black Robert Madden) and what type of time frame we are looking at so we can let the patient know something.  PV and [facility] colonoscopy canceled. Thank you, Robert Madden

## 2024-03-17 ENCOUNTER — Ambulatory Visit: Attending: Cardiology | Admitting: Internal Medicine

## 2024-03-17 ENCOUNTER — Encounter: Payer: Self-pay | Admitting: Internal Medicine

## 2024-03-17 VITALS — BP 126/82 | HR 72 | Ht 72.0 in | Wt 219.2 lb

## 2024-03-17 DIAGNOSIS — I471 Supraventricular tachycardia, unspecified: Secondary | ICD-10-CM | POA: Diagnosis not present

## 2024-03-17 NOTE — Progress Notes (Signed)
 HPI [name] returns today for followup. He is a pleasant 63 yo man who is very active. He has a h/o aortic aneurysm and SVT who underwent TEE after presenting with E.Faecalis bacteremia and was found to have a small vegetation on the aortic valve. 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 In addition, his aortic root measured 4.7 cm. He has not had any clinic symptoms of AI. He has no recurrent fevers and repeat blood cultures have been sterile. His SVT has been quiet.  Allergies  Allergen Reactions   Ampicillin  Rash    Pt describes itchy rash in 4th grade.  No throat swelling or breathing problems.  He then had amoxicillin challenge but developed fulll body rash on high dose ampicillin  + ceftriaxone    Atorvastatin     joint aches   Simvastatin Other (See Comments)    Joint aches   Honey Rash     Current Outpatient Medications  Medication Sig Dispense Refill   ibuprofen (ADVIL) 200 MG tablet Take 400 mg by mouth every 4 (four) hours as needed for headache or mild pain (pain score 1-3).     metoprolol  succinate (TOPROL -XL) 50 MG 24 hr tablet Take 1 tablet (50 mg total) by mouth daily. 90 tablet 3   pravastatin  (PRAVACHOL ) 40 MG tablet Take 1 tablet (40 mg total) by mouth daily. 90 tablet 3   No current facility-administered medications for this visit.     Past Medical History:  Diagnosis Date   Aortic insufficiency    Drug rash 01/30/2024   Enterococcus faecalis infection 01/18/2024   Hyperlipidemia    IBS (irritable bowel syndrome)    Rash 01/18/2024   Tachycardia, paroxysmal (HCC)     ROS:   All systems reviewed and negative except as noted in the HPI.   Past Surgical History:  Procedure Laterality Date   TRANSESOPHAGEAL ECHOCARDIOGRAM (CATH LAB) N/A 01/10/2024   Procedure: TRANSESOPHAGEAL ECHOCARDIOGRAM;  Surgeon: Wendie Hamburg, MD;   Location: [facility] INVASIVE CV LAB;  Service: Cardiovascular;  Laterality: N/A;   TRANSESOPHAGEAL ECHOCARDIOGRAM (CATH LAB) N/A 03/07/2024   Procedure: TRANSESOPHAGEAL ECHOCARDIOGRAM;  Surgeon: Jerryl Morin, DO;  Location: [facility] INVASIVE CV LAB;  Service: Cardiovascular;  Laterality: N/A;     Family History  Problem Relation Age of Onset   Hypertension Mother    Heart attack Neg Hx    Stroke Neg Hx      Social History   Socioeconomic History   Marital status: Married    Spouse name: Not on file   Number of children: 2   Years of education: Not on file   Highest education level: Not on file  Occupational History   Occupation: school principal  Tobacco Use   Smoking status: Never   Smokeless tobacco: Never  Vaping Use   Vaping status: Never Used  Substance and Sexual Activity   Alcohol use: No   Drug use: No   Sexual activity: Not on file  Other Topics Concern   Not on file  Social History Narrative   Not on file   Social Drivers of Health   Financial Resource Strain: Not on file  Food Insecurity: No Food Insecurity (01/07/2024)   Hunger Vital Sign    Worried About Running Out of Food in the Last Year: Never true    Ran Out of Food in the Last Year: Never true  Transportation Needs: No Transportation Needs (01/07/2024)   PRAPARE - Administrator, Civil Service (Medical): No    Lack of Transportation (Non-Medical): No  Physical Activity: Not on file  Stress: Not on file  Social Connections: Not on file  Intimate Partner Violence: Not At Risk (01/07/2024)   Humiliation, Afraid, Rape, and Kick questionnaire    Fear of Current or Ex-Partner: No    Emotionally Abused: No    Physically Abused: No    Sexually Abused: No     BP 126/82   Pulse 72   Ht 6' (1.829 m)   Wt 219 lb 3.2 oz (99.4 kg)   SpO2 99%   BMI 29.73 kg/m   Physical Exam:  Well appearing middle aged man, NAD HEENT: Unremarkable Neck:  No JVD, no thyromegally Lymphatics:  No adenopathy Back:  No  CVA tenderness Lungs:  Clear with no wheezes HEART:  Regular rate rhythm, no murmurs, no rubs, no clicks Abd:  soft, positive bowel sounds, no organomegally, no rebound, no guarding Ext:  2 plus pulses, no edema, no cyanosis, no clubbing Skin:  No rashes no nodules Neuro:  CN II through XII intact, motor grossly intact  EKG - nsr    Assess/Plan: AI/aortic aneurysm/aortic valve veg - he has no recurrent infectious symptoms. I am concerned about embolic phenomena, worsening AI which is approaching surgical disease and the enlarged aortic root. I'll send him back to Dr. Sherene Dilling to discuss surgical options. If he deems surgery necessary, he will need left heart cath. SVT - his symptoms appear to be well controlled.

## 2024-03-22 ENCOUNTER — Encounter

## 2024-03-23 ENCOUNTER — Other Ambulatory Visit: Payer: Self-pay

## 2024-03-23 ENCOUNTER — Encounter: Payer: Self-pay | Admitting: Internal Medicine

## 2024-03-23 DIAGNOSIS — I77810 Thoracic aortic ectasia: Secondary | ICD-10-CM

## 2024-03-23 DIAGNOSIS — I7121 Aneurysm of the ascending aorta, without rupture: Secondary | ICD-10-CM

## 2024-03-23 NOTE — Telephone Encounter (Signed)
 Left message for patient to return call to further discuss appointment with Dr Karene Oto.  Will continue efforts.

## 2024-03-24 NOTE — Telephone Encounter (Signed)
 Left message for patient to return call to further discuss appointment with Dr Karene Oto.  Will continue efforts.

## 2024-03-24 NOTE — Telephone Encounter (Signed)
 Spoke with patient regarding a new patient consult with Dr Karene Oto prior to scheduling a colonoscopy at the hospital. Patient would rather wait to schedule an appointment until after he has seen Dr Sherene Dilling with cardiothoracic surgery.  Patient stated that he will contact our office once his "cardiac issues" have been taken care.  No further questions.

## 2024-03-27 ENCOUNTER — Ambulatory Visit (INDEPENDENT_AMBULATORY_CARE_PROVIDER_SITE_OTHER): Admitting: Internal Medicine

## 2024-03-27 ENCOUNTER — Other Ambulatory Visit: Payer: Self-pay

## 2024-03-27 ENCOUNTER — Encounter: Payer: Self-pay | Admitting: Internal Medicine

## 2024-03-27 VITALS — BP 139/88 | HR 71 | Temp 98.2°F | Ht 72.0 in | Wt 225.0 lb

## 2024-03-27 DIAGNOSIS — A498 Other bacterial infections of unspecified site: Secondary | ICD-10-CM | POA: Diagnosis not present

## 2024-03-27 NOTE — Progress Notes (Unsigned)
 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   METOPROLOL  SUCCINATE (TOPROL -XL) 50 MG 24 HR TABLET    Take 1 tablet (50 mg total) by mouth daily.   PRAVASTATIN  (PRAVACHOL ) 40 MG TABLET    Take 1 tablet (40 mg total) by mouth daily.  Modified Medications   No medications on file  Discontinued Medications   No medications on file    Subjective: Robert Madden is a 63 y.o. 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 He had childhood allergy to PCN but tolerated amoxicillin challenge and was placed on dual beta lactam therapy with continuous IV ampicillin  + ceftriaxone  2 grams IV q 12 hours but then developed a rash within the last 3 days  and was switched to daptomycin .   Discussed the use of AI scribe software for clinical note transcription with the patient, who gave verbal consent to proceed.   History of Present Illness   The patient, with a history of heart valve infection (aortic valve), presents with a rash. He was previously treated with IV ampicillin  and  ceftriaxone  for the infection. However, he developed a rash, which he believes is due to an allergic reaction to ampicillin . The rash is not itchy, but it is widespread, covering his back, chest, and face. The rash started a couple of days ago, initially on his back, and has since spread. He has a history of penicillin allergy from childhood, which was thought to have resolved when he had no problems with the amoxicillin challenge.. He has changed to daptomycin , and the previous antibiotics were stopped yesterday. He also has a PICC line in place, which appears to be in good condition. He also has a history of aortic aneurysm, which is being monitored.    "  Today 03/27/24: doing well no new complaints.  Review of Systems: Review of Systems  All other systems reviewed and are negative.   Past Medical History:  Diagnosis Date   Aortic insufficiency    Drug rash 01/30/2024   Enterococcus faecalis infection 01/18/2024   Hyperlipidemia    IBS (irritable bowel syndrome)    Rash 01/18/2024   Tachycardia, paroxysmal (HCC)     Social History   Tobacco Use   Smoking status: Never   Smokeless tobacco: Never  Vaping Use   Vaping status: Never Used  Substance Use Topics   Alcohol use: No   Drug use: No    Family History  Problem Relation Age of Onset   Hypertension Mother    Heart attack Neg Hx    Stroke Neg  Hx     Allergies  Allergen Reactions   Ampicillin  Rash    Pt describes itchy rash in 4th grade.  No throat swelling or breathing problems.  He then had amoxicillin challenge but developed fulll body rash on high dose ampicillin  + ceftriaxone    Atorvastatin     joint aches   Simvastatin Other (See Comments)    Joint aches   Honey Rash    Health Maintenance  Topic Date Due   Pneumococcal Vaccine 84-36 Years old (1 of 2 - PCV) Never done   Colonoscopy  Never done   Zoster Vaccines- Shingrix (2 of 2) 02/18/2018   COVID-19 Vaccine (1 - 2024-25 season) Never done   INFLUENZA  VACCINE  06/30/2024   DTaP/Tdap/Td (4 - Td or Tdap) 11/26/2024   Hepatitis C Screening  Completed   HIV Screening  Completed   HPV VACCINES  Aged Out   Meningococcal B Vaccine  Aged Out    Objective:  Vitals:   03/27/24 1320  BP: 139/88  Pulse: 71  Temp: 98.2 F (36.8 C)  TempSrc: Oral  SpO2: 100%  Weight: 225 lb (102.1 kg)  Height: 6' (1.829 m)   Body mass index is 30.52 kg/m.  Physical Exam Constitutional:      General: He is not in acute distress.    Appearance: He is normal weight. He is not toxic-appearing.  HENT:     Head: Normocephalic and atraumatic.     Right Ear: External ear normal.     Left Ear: External ear normal.     Nose: No congestion or rhinorrhea.     Mouth/Throat:     Mouth: Mucous membranes are moist.     Pharynx: Oropharynx is clear.  Eyes:     Extraocular Movements: Extraocular movements intact.     Conjunctiva/sclera: Conjunctivae normal.     Pupils: Pupils are equal, round, and reactive to light.  Cardiovascular:     Rate and Rhythm: Normal rate and regular rhythm.     Heart sounds: No murmur heard.    No friction rub. No gallop.  Pulmonary:     Effort: Pulmonary effort is normal.     Breath sounds: Normal breath sounds.  Abdominal:     General: Abdomen is flat. Bowel sounds are normal.     Palpations: Abdomen is soft.  Musculoskeletal:        General: No swelling. Normal range of motion.     Cervical back: Normal range of motion and neck supple.  Skin:    General: Skin is warm and dry.  Neurological:     General: No focal deficit present.     Mental Status: He is oriented to person, place, and time.  Psychiatric:        Mood and Affect: Mood normal.    Physical Exam   Lab Results Lab Results  Component Value Date   WBC 4.8 02/28/2024   HGB 14.8 02/28/2024   HCT 44.5 02/28/2024   MCV 86.6 02/28/2024   PLT 224 02/28/2024    Lab Results  Component Value Date   CREATININE 1.13 02/28/2024   BUN 13 02/28/2024   NA 136  02/28/2024   K 4.8 02/28/2024   CL 102 02/28/2024   CO2 25 02/28/2024    Lab Results  Component Value Date   ALT 52 (H) 02/28/2024   AST 29 02/28/2024   ALKPHOS 55 01/11/2024   BILITOT 0.6 02/28/2024    Lab Results  Component Value Date  CHOL 194 01/09/2024   HDL 32 (L) 01/09/2024   LDLCALC 144 (H) 01/09/2024   TRIG 88 01/09/2024   CHOLHDL 6.1 01/09/2024   Lab Results  Component Value Date   LABRPR NON-REACTIVE 01/06/2024   No results found for: "HIV1RNAQUANT", "HIV1RNAVL", "CD4TABS"   Problem List Items Addressed This Visit   None  Results   Assessment/Plan 63 year old male presented to [facility] with referral for FUO found to have positive blood cultures admitted with: #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 Possible hx of paronychia. No GI symptoms piror to hospitalization. - Blood cultures  grew E faecalis.  Patient started on vancomycin . - TTE showed concern for mobile mass on native aortic valve, TEE showed small echodensity measuting 0.5cm -MRI brain 9 showed signal abnormality tiny acute to subacute ischemic infarct in high left frontal temporal lobe.  I agree this is more consistent with infarct rather than emboli -TEE showed small echodensity on the noncoronary cusp of aortic valve measuring 0.5 cm concerning vegetation, moderate AI  #Ampicillin  allergy #Back Rash -Also there is some concern of a rash that developed after vancomycin  on his back which is pruritic following first dose of antibiotics to admission. Does not seem  typical of drug rash. Improved at last visit. Tolerated amp challenge. Then developed rash on amp + ctx. Transitioned to daptomycin  to complete abx x 6 weeks  -TTE on 3/26 showed "persistence of a mobile density on the noncoronary cusp  concerning for vegetation  " -Completed abx on 3/22. No HA and fever.  -3/19 labs stable, picc pulled -Seen by GI; colonoscopy deferred once cardiac issues managed.  -Seen by cards, tee on 4/8 showed small mobile denisty( 0.57x0.22cm) in Av and AI with aortic aneurysm. Referred to [facility](dr. Bartle) Plan -If pt taken to Or for arotic valve veg then will do 6 weeks of iV abx from OR given multiple abcomalities and no po optionsgiven amp allergry.  Labs are stable, bcx ng on 3/31 as such small density c/w sterile mass.  -Fever precautions.  -F/U in 2 months.     Orlie Bjornstad, MD [facility] 03/27/2024, 1:23 PM   I have personally spent 45 minutes involved in face-to-face and non-face-to-face activities for this patient on the day of the visit. Professional time spent includes the following activities: Preparing to see the patient (review of tests), Obtaining and/or reviewing separately obtained history (admission/discharge record), Performing a medically appropriate examination and/or evaluation , Ordering medications/tests/procedures, referring and communicating with other health care professionals, Documenting clinical information in the EMR, Independently interpreting results (not separately reported), Communicating results to the patient/family/caregiver, Counseling and educating the patient/family/caregiver and Care coordination (not separately reported).

## 2024-03-30 ENCOUNTER — Encounter: Admitting: Gastroenterology

## 2024-05-03 ENCOUNTER — Ambulatory Visit: Attending: Surgery | Admitting: Surgery

## 2024-05-03 ENCOUNTER — Encounter: Payer: Self-pay | Admitting: Surgery

## 2024-05-03 VITALS — BP 135/90 | HR 66 | Resp 18 | Ht 72.0 in | Wt 227.0 lb

## 2024-05-03 DIAGNOSIS — I351 Nonrheumatic aortic (valve) insufficiency: Secondary | ICD-10-CM

## 2024-05-03 DIAGNOSIS — I7121 Aneurysm of the ascending aorta, without rupture: Secondary | ICD-10-CM | POA: Diagnosis not present

## 2024-05-03 DIAGNOSIS — I358 Other nonrheumatic aortic valve disorders: Secondary | ICD-10-CM | POA: Diagnosis not present

## 2024-05-03 NOTE — Progress Notes (Signed)
 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  Previous echocardiogram in 2016 showed a normal trileaflet aortic valve with mild insufficiency.  I last saw him in January 2024. 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  2D echocardiogram on 01/07/2024 showed a small mobile echodensity on the noncoronary cusp of the valve with mild insufficiency.  TEE on 01/10/2024 showed a 0.5 cm echodensity on the noncoronary cusp of the aortic valve concerning for vegetation with moderate aortic insufficiency.  The aortic root was measured at 4.6 cm with an ascending aorta of 4.2 cm.  Left ventricular ejection fraction was 60 to 65%.  A follow-up echocardiogram on 02/23/2024 showed a persistent small mobile density on the noncoronary cusp with moderate aortic insufficiency with pressure half-time of 453 ms.  A repeat TEE on 03/07/2024 showed persistence of this small mobile echodensity on the noncoronary cusp which was unchanged in size.  There was felt to be moderate to severe aortic insufficiency.  The aortic root was measured at 4.7 cm.  Left ventricular ejection fraction was 60 to 65%.  An MRI of the brain in February when he initially presented with endocarditis showed a single punctate focus of diffusion signal abnormality in the high left frontal lobe suspicious for a tiny acute to subacute ischemic infarct.  It was felt that this was probably not embolic. He completed a 6-week course of antibiotics in March and subsequent cultures have been negative.  It was recommended that he have a colonoscopy performed given the Enterococcus bacteremia to look for any potential sources.  He was seen by GI and colonoscopy was deferred until his cardiac issues were addressed.  He  continues to feel well overall with no chest pain or shortness of breath.  He has had no peripheral edema.  Current Outpatient Medications  Medication Sig Dispense Refill   ibuprofen (ADVIL) 200 MG tablet Take 400 mg by mouth every 4 (four) hours as needed for headache or mild pain (pain score 1-3).     metoprolol  succinate (TOPROL -XL) 50 MG 24 hr tablet Take 1 tablet (50 mg total) by mouth daily. 90 tablet 3   pravastatin  (PRAVACHOL ) 40 MG tablet Take 1 tablet (40 mg total) by mouth daily. 90 tablet 3   No current facility-administered medications for this visit.     Physical Exam: BP (!) 135/90 (BP Location: Left Arm)   Pulse 66   Resp 18   Ht 6' (1.829 m)   Wt 227 lb (103 kg)   SpO2 97% Comment: RA  BMI 30.79 kg/m  He looks well Cardiac exam shows a regular rate and rhythm with normal heart sounds and no murmur. Lungs are clear There is no peripheral edema.  Diagnostic Tests:    TRANSESOPHOGEAL ECHO REPORT       Patient Name:   Robert Madden Date of Exam: 03/07/2024  Medical Rec #:  [id number]         Height:       72.0 in  Accession #:    [id number]        Weight:       221.8 lb  Date of Birth:  June 11, 1961  BSA:          2.226 m  Patient Age:    62 years          BP:           141/74 mmHg  Patient Gender: M                 HR:           87 bpm.  Exam Location:  Inpatient   Procedure: 3D Echo, Transesophageal Echo, Cardiac Doppler and Color  Doppler            (Both Spectral and Color Flow Doppler were utilized during             procedure).   Indications:     Endocarditis    History:         Patient has prior history of Echocardiogram examinations,  most                  recent 02/23/2024. Arrythmias:Tachycardia.    Sonographer:     Terrilee Few RCS  Referring Phys:  [id number] KARDIE TOBB  Diagnosing Phys: Kardie Tobb DO   PROCEDURE: After discussion of the risks and benefits of a TEE, an  informed consent was obtained from the patient. The  transesophogeal probe  was passed without difficulty through the esophogus of the patient. Imaged  were obtained with the patient in a  left lateral decubitus position. Sedation performed by different  physician. The patient was monitored while under deep sedation.  Anesthestetic sedation was provided intravenously by Anesthesiology:  588.92mg  of Propofol . The patient developed no  complications during the procedure.    IMPRESSIONS     1. Left ventricular ejection fraction, by estimation, is 60 to 65%. The  left ventricle has normal function.   2. Right ventricular systolic function is normal. The right ventricular  size is normal.   3. No left atrial/left atrial appendage thrombus was detected. The LAA  emptying velocity was 78 cm/s.   4. The mitral valve is normal in structure. Mild to moderate mitral valve  regurgitation. No evidence of mitral stenosis.   5. There is a small ( 0.57 cm x 0.22 cm) mobile echodensity on the  noncoronary cusp- this was present on prior TEE with no change in size.  The aortic valve is abnormal. Aortic valve regurgitation is moderate to  severe. No aortic stenosis is present.   6. Aortic dilatation noted. Aneurysm of the aortic root, measuring 47 mm.  There is mild dilatation of the ascending aorta, measuring 41 mm.   7. 3D performed of the aortic valve and demonstrates evaluation of aortic  valve.   FINDINGS   Left Ventricle: Left ventricular ejection fraction, by estimation, is 60  to 65%. The left ventricle has normal function. The left ventricular  internal cavity size was normal in size.   Right Ventricle: The right ventricular size is normal. No increase in  right ventricular wall thickness. Right ventricular systolic function is  normal.   Left Atrium: Left atrial size was normal in size. No left atrial/left  atrial appendage thrombus was detected. The LAA emptying velocity was 78  cm/s.   Right Atrium: Right atrial size was normal in  size.   Pericardium: There is no evidence of pericardial effusion.   Mitral Valve: The mitral valve is normal in structure. Mild to moderate  mitral valve regurgitation. No evidence of mitral valve stenosis.   Tricuspid Valve: The tricuspid  valve is normal in structure. Tricuspid  valve regurgitation is trivial. No evidence of tricuspid stenosis.   Aortic Valve: There is a small ( 0.57 cm x 0.22 cm) mobile echodensity on  the noncoronary cusp- this was present on prior TEE with no change in  size. The aortic valve is abnormal. Aortic valve regurgitation is moderate  to severe. No aortic stenosis is  present.   Pulmonic Valve: The pulmonic valve was not well visualized. Pulmonic valve  regurgitation is trivial. No evidence of pulmonic stenosis.   Aorta: Aortic dilatation noted. There is mild dilatation of the ascending  aorta, measuring 41 mm. There is an aneurysm involving the aortic root  measuring 47 mm.   IAS/Shunts: No atrial level shunt detected by color flow Doppler.   Additional Comments: Spectral Doppler performed.   LEFT VENTRICLE  PLAX 2D  LVOT diam:     2.50 cm  LVOT Area:     4.91 cm       AORTA  Ao Root diam: 4.70 cm  Ao Asc diam:  4.05 cm     SHUNTS  Systemic Diam: 2.50 cm   Kardie Tobb DO  Electronically signed by Jerryl Morin DO  Signature Date/Time: 03/07/2024/2:48:28 PM        Final     Impression:  He has a 4.7 cm aortic root with moderate to severe AI and a persistent small echodensity on the noncoronary cusp that is likely a sterile vegetation. His AI has worsened since developing endocarditis. He is asymptomatic and his LVEF is normal with normal LV dimensions.  I think this is going to require surgical repair in the not-too-distant future but there are no absolute indications for repair at this time. His aneurysm is still well below the surgical threshold of 5.5 cm. I think he should proceed ahead with colonoscopy to be sure there is not a  colonic source for bacteremia.   Plan:  I will see him back in 6 months with a 2D echo and CTA chest to followup on his aortic root aneurysm and AI.    Bartley Lightning, MD [facility] [phone]

## 2024-05-29 ENCOUNTER — Other Ambulatory Visit: Payer: Self-pay | Admitting: Pulmonary Disease

## 2024-05-29 ENCOUNTER — Encounter: Payer: Self-pay | Admitting: Internal Medicine

## 2024-05-29 ENCOUNTER — Ambulatory Visit (INDEPENDENT_AMBULATORY_CARE_PROVIDER_SITE_OTHER): Admitting: Internal Medicine

## 2024-05-29 ENCOUNTER — Other Ambulatory Visit: Payer: Self-pay

## 2024-05-29 ENCOUNTER — Telehealth: Payer: Self-pay | Admitting: Gastroenterology

## 2024-05-29 VITALS — BP 133/85 | HR 62 | Temp 97.7°F | Ht 72.0 in | Wt 226.0 lb

## 2024-05-29 DIAGNOSIS — E785 Hyperlipidemia, unspecified: Secondary | ICD-10-CM

## 2024-05-29 DIAGNOSIS — Z01812 Encounter for preprocedural laboratory examination: Secondary | ICD-10-CM

## 2024-05-29 DIAGNOSIS — I471 Supraventricular tachycardia, unspecified: Secondary | ICD-10-CM

## 2024-05-29 DIAGNOSIS — I7121 Aneurysm of the ascending aorta, without rupture: Secondary | ICD-10-CM

## 2024-05-29 DIAGNOSIS — A498 Other bacterial infections of unspecified site: Secondary | ICD-10-CM

## 2024-05-29 NOTE — Telephone Encounter (Signed)
 Patient needs an OV before he can be rescheduled for hospital colonoscopy. Thanks

## 2024-05-29 NOTE — Telephone Encounter (Signed)
 Inbound call from patient, would like to schedule his colonoscopy. Per Dr. San procedure to be done at the hospital. Patient states he has been released from Cardiology.

## 2024-05-29 NOTE — Progress Notes (Unsigned)
 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   METOPROLOL  SUCCINATE (TOPROL -XL) 50 MG 24 HR TABLET    Take 1 tablet (50 mg total) by mouth daily.   PRAVASTATIN  (PRAVACHOL ) 40 MG TABLET    Take 1 tablet (40 mg total) by mouth daily.  Modified Medications   No medications on file  Discontinued Medications   No medications on file    Subjective: Robert Madden is a 63 y.o. 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 He had childhood allergy to PCN but tolerated amoxicillin challenge and was placed on dual beta lactam therapy with continuous IV ampicillin  + ceftriaxone  2 grams IV q 12 hours but then developed a rash within the last 3 days  and was switched to daptomycin .   Discussed the use of AI scribe software for clinical note transcription with the patient, who gave verbal consent to proceed.   History of Present Illness   The patient, with a history of heart valve infection (aortic valve), presents with a rash. He was previously treated with IV ampicillin  and  ceftriaxone  for the infection. However, he developed a rash, which he believes is due to an allergic reaction to ampicillin . The rash is not itchy, but it is widespread, covering his back, chest, and face. The rash started a couple of days ago, initially on his back, and has since spread. He has a history of penicillin allergy from childhood, which was thought to have resolved when he had no problems with the amoxicillin challenge.. He has changed to daptomycin , and the previous antibiotics were stopped yesterday. He also has a PICC line in place, which appears to be in good condition. He also has a history of aortic aneurysm, which is being monitored.        03/27/24: doing well no new complaints.  Today 6/30: No new complaints.  Review of Systems: Review of Systems  All other systems reviewed and are negative.   Past Medical History:  Diagnosis Date   Aortic insufficiency    Drug rash 01/30/2024   Enterococcus faecalis infection 01/18/2024   Hyperlipidemia    IBS (irritable bowel syndrome)    Rash 01/18/2024   Tachycardia, paroxysmal (HCC)     Social History   Tobacco Use   Smoking status: Never   Smokeless tobacco: Never  Vaping Use   Vaping status: Never Used  Substance Use Topics   Alcohol use: No   Drug use: No    Family History  Problem Relation Age of Onset   Hypertension Mother    Heart attack  Neg Hx    Stroke Neg Hx     Allergies  Allergen Reactions   Ampicillin  Rash    Pt describes itchy rash in 4th grade.  No throat swelling or breathing problems.  He then had amoxicillin challenge but developed fulll body rash on high dose ampicillin  + ceftriaxone    Atorvastatin     joint aches   Simvastatin Other (See Comments)    Joint aches   Honey Rash    Health Maintenance  Topic Date Due   Pneumococcal Vaccine 43-1 Years old (1 of 2 - PCV) Never done   Colonoscopy  Never done   Zoster Vaccines- Shingrix (2 of 2) 02/18/2018   COVID-19 Vaccine (3 - 2024-25  season) 08/01/2023   INFLUENZA VACCINE  06/30/2024   DTaP/Tdap/Td (4 - Td or Tdap) 11/26/2024   Hepatitis C Screening  Completed   HIV Screening  Completed   Hepatitis B Vaccines  Aged Out   HPV VACCINES  Aged Out   Meningococcal B Vaccine  Aged Out    Objective:  Vitals:   05/29/24 1039  BP: 133/85  Pulse: 62  Temp: 97.7 F (36.5 C)  TempSrc: Temporal  SpO2: 97%  Weight: 226 lb (102.5 kg)  Height: 6' (1.829 m)   Body mass index is 30.65 kg/m.  Physical Exam Constitutional:      General: He is not in acute distress.    Appearance: He is normal weight. He is not toxic-appearing.  HENT:     Head: Normocephalic and atraumatic.     Right Ear: External ear normal.     Left Ear: External ear normal.     Nose: No congestion or rhinorrhea.     Mouth/Throat:     Mouth: Mucous membranes are moist.     Pharynx: Oropharynx is clear.  Eyes:     Extraocular Movements: Extraocular movements intact.     Conjunctiva/sclera: Conjunctivae normal.     Pupils: Pupils are equal, round, and reactive to light.  Cardiovascular:     Rate and Rhythm: Normal rate and regular rhythm.     Heart sounds: No murmur heard.    No friction rub. No gallop.  Pulmonary:     Effort: Pulmonary effort is normal.     Breath sounds: Normal breath sounds.  Abdominal:     General: Abdomen is flat. Bowel sounds are normal.     Palpations: Abdomen is soft.  Musculoskeletal:        General: No swelling. Normal range of motion.     Cervical back: Normal range of motion and neck supple.  Skin:    General: Skin is warm and dry.  Neurological:     General: No focal deficit present.     Mental Status: He is oriented to person, place, and time.  Psychiatric:        Mood and Affect: Mood normal.    Physical Exam   Lab Results Lab Results  Component Value Date   WBC 4.8 02/28/2024   HGB 14.8 02/28/2024   HCT 44.5 02/28/2024   MCV 86.6 02/28/2024   PLT 224 02/28/2024    Lab Results  Component  Value Date   CREATININE 1.13 02/28/2024   BUN 13 02/28/2024   NA 136 02/28/2024   K 4.8 02/28/2024   CL 102 02/28/2024   CO2 25 02/28/2024    Lab Results  Component Value Date   ALT 52 (H) 02/28/2024   AST 29 02/28/2024   ALKPHOS 55 01/11/2024  BILITOT 0.6 02/28/2024    Lab Results  Component Value Date   CHOL 194 01/09/2024   HDL 32 (L) 01/09/2024   LDLCALC 144 (H) 01/09/2024   TRIG 88 01/09/2024   CHOLHDL 6.1 01/09/2024   Lab Results  Component Value Date   LABRPR NON-REACTIVE 01/06/2024   No results found for: HIV1RNAQUANT, HIV1RNAVL, CD4TABS   Problem List Items Addressed This Visit   None  Results   Assessment/Plan 63 year old male presented to [facility] with referral for FUO found to have positive blood cultures admitted with: #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 Possible hx of paronychia. No GI symptoms piror to hospitalization. - Blood cultures  grew E faecalis.  Patient started on vancomycin . - TTE showed concern for mobile mass on native aortic valve, TEE showed small echodensity measuting 0.5cm -MRI brain 9 showed signal abnormality tiny acute to subacute ischemic infarct in high left frontal temporal lobe.  I agree this is more consistent with infarct rather than emboli -TEE showed small echodensity on the noncoronary cusp of aortic valve measuring 0.5 cm concerning vegetation, moderate AI   #Ampicillin  allergy #Back Rash -Also there is some concern of a rash that developed after vancomycin  on his back which is pruritic following first dose of antibiotics to admission. Does not seem  typical of drug rash. Improved at last visit. Tolerated amp challenge. Then developed rash on amp + ctx. Transitioned to daptomycin  to complete abx x 6 weeks  -TTE on 3/26 showed persistence of  a mobile density on the noncoronary cusp  concerning for vegetation  -Completed abx on 3/22. No HA and fever.  - 3/31 stable labs -Seen by GI; colonoscopy deferred once cardiac issues managed.  -Seen by cards, tee on 4/8 showed small mobile denisty( 0.57x0.22cm) in Av and AI with aortic aneurysm. Referred to CTS(dr. Bartle) Plan -If pt taken to Or for arotic valve veg then will do 6 weeks of iV abx from OR given multiple abcomalities and no po options given amp allergry.  Labs are stable, bcx ng on 3/31 as such small density c/w sterile mass.  Patient was seen by cardiothoracic, Dr. Sherrine on 05/03/2024 who noted that echodensity on noncoronary cusp is likely external vegetation.  He will require surgical repair in not-too-distant future, no absolute indication for surgery at this point.  Noted he needs colonoscopy o look for colonic source of bacteremia.  Plan on follow-up in 6 months with CTA chest. Plan: -Pt plans on scheduling colonoscopy given CTS reccs. -Fever precautions.  -F/U in 6 months.      Loney Stank, MD [facility] 05/29/2024, 10:50 AM   I have personally spent 42 minutes involved in face-to-face and non-face-to-face activities for this patient on the day of the visit. Professional time spent includes the following activities: Preparing to see the patient (review of tests), Obtaining and/or reviewing separately obtained history (admission/discharge record), Performing a medically appropriate examination and/or evaluation , Ordering medications/tests/procedures, referring and communicating with other health care professionals, Documenting clinical information in the EMR, Independently interpreting results (not separately reported), Communicating results to the patient/family/caregiver, Counseling and educating the patient/family/caregiver and Care coordination (not separately reported).

## 2024-05-31 ENCOUNTER — Encounter: Payer: Self-pay | Admitting: Physician Assistant

## 2024-05-31 NOTE — Telephone Encounter (Signed)
 Patient called and stated that he is not understanding why he is needing a office visit and stated that when he spoke to the nurse no one mentioned he was needing to come in to the office. Patient stated he was not having a good experience with our office at this point and is requesting a call back today. Please advise.

## 2024-05-31 NOTE — Telephone Encounter (Signed)
 Returned call to patient. Phone goes straight to vm. I left patient a detailed vm letting him know Dr. San wanted to see him in the office to discuss preop colonoscopy risks after his cardiac workup. MD would like to review this with patient in detail prior to scheduling colonoscopy. I advised patient to call back if he has any additional questions. See note from Dr. San below from 03/15/24 TE.  Cardiology note from 03/17/2024 reviewed.  Patient with history of E faecalis bacteremia with small vegetation on aortic valve noted on TEE.  Was treated with IV antibiotics, and repeat TEE 2 months later showed the vegetation remained and noted to have moderate/severe AI and aortic root measuring 4.7 cm.  Reviewed blood cultures otherwise negative.  Patient was referred to Cardiothoracic Surgery.   This patient has otherwise not been seen in the GI clinic, but referred by the ID clinic on 02/28/2024 for colonoscopy due to the recent E faecalis infection.  I recommend the following:  - Schedule OV in the GI clinic to be scheduled after he has a chance to be seen in the Cardiothoracic Surgery clinic.  Will plan on discussing preoperative colonoscopy depending on recommendations from CT surgery  - When scheduling colonoscopy,  this would need to be done in the hospital endoscopy unit due to elevated cardiovascular risks.

## 2024-07-25 ENCOUNTER — Encounter: Payer: Self-pay | Admitting: Physician Assistant

## 2024-07-25 ENCOUNTER — Ambulatory Visit: Admitting: Physician Assistant

## 2024-07-25 VITALS — BP 140/80 | HR 68 | Ht 70.0 in | Wt 227.2 lb

## 2024-07-25 DIAGNOSIS — A498 Other bacterial infections of unspecified site: Secondary | ICD-10-CM

## 2024-07-25 DIAGNOSIS — I358 Other nonrheumatic aortic valve disorders: Secondary | ICD-10-CM

## 2024-07-25 DIAGNOSIS — I38 Endocarditis, valve unspecified: Secondary | ICD-10-CM | POA: Diagnosis not present

## 2024-07-25 DIAGNOSIS — I351 Nonrheumatic aortic (valve) insufficiency: Secondary | ICD-10-CM

## 2024-07-25 DIAGNOSIS — Q2543 Congenital aneurysm of aorta: Secondary | ICD-10-CM

## 2024-07-25 DIAGNOSIS — R7989 Other specified abnormal findings of blood chemistry: Secondary | ICD-10-CM | POA: Diagnosis not present

## 2024-07-25 MED ORDER — NA SULFATE-K SULFATE-MG SULF 17.5-3.13-1.6 GM/177ML PO SOLN: 1.0000 | Freq: Once | ORAL | 0 refills | Status: AC

## 2024-07-25 NOTE — Patient Instructions (Signed)
 You have been scheduled for a colonoscopy. Please follow written instructions given to you at your visit today.   If you use inhalers (even only as needed), please bring them with you on the day of your procedure.  DO NOT TAKE 7 DAYS PRIOR TO TEST- Trulicity (dulaglutide) Ozempic, Wegovy (semaglutide) Mounjaro (tirzepatide) Bydureon Bcise (exanatide extended release)  DO NOT TAKE 1 DAY PRIOR TO YOUR TEST Rybelsus (semaglutide) Adlyxin (lixisenatide) Victoza (liraglutide) Byetta (exanatide) ___________________________________________________________________________   Thank you for trusting me with your gastrointestinal care!   Alan Coombs, PA-C  _______________________________________________________  If your blood pressure at your visit was 140/90 or greater, please contact your primary care physician to follow up on this.  _______________________________________________________  If you are age 61 or older, your body mass index should be between 23-30. Your Body mass index is 32.61 kg/m. If this is out of the aforementioned range listed, please consider follow up with your Primary Care Provider.  If you are age 61 or younger, your body mass index should be between 19-25. Your Body mass index is 32.61 kg/m. If this is out of the aformentioned range listed, please consider follow up with your Primary Care Provider.   ________________________________________________________  The [place] [facility] providers would like to encourage you to use MYCHART to communicate with providers for non-urgent requests or questions.  Due to long hold times on the telephone, sending your provider a message by [facility] may be a faster and more efficient way to get a response.  Please allow 48 business hours for a response.  Please remember that this is for non-urgent requests.  _______________________________________________________  [facility] is using a team-based approach to care.  Your  team is made up of your doctor and two to three APPS. Our APPS (Nurse Practitioners and Physician Assistants) work with your physician to ensure care continuity for you. They are fully qualified to address your health concerns and develop a treatment plan. They communicate directly with your gastroenterologist to care for you. Seeing the Advanced Practice Practitioners on your physician's team can help you by facilitating care more promptly, often allowing for earlier appointments, access to diagnostic testing, procedures, and other specialty referrals.

## 2024-07-25 NOTE — H&P (View-Only) (Signed)
 07/25/2024 Robert Madden [id number] Nov 30, 1961  Referring provider: Leonel Cole, MD Primary GI doctor: Dr. San  ASSESSMENT AND PLAN:  E.Faecalis Bacteremia with endocarditis with CVA Last colonoscopy 10 years ago possibly at [facility], can try to get records, recall was 5 years which patient missed, no family history of colon cancer S/p treatment with ID x 2 months IV ABX and following cardiology Repeat blood cultures negative Repeat TEE after treatment on 03/07/24 showed EF 60-65%, moderate to severe AR with small mobile echodensity on non coronary cusp Saw Dr. Lucas on 05/03/24, feels no immediate need for surgery, no symptoms of AR at this time and states to proceed with colonoscopy to investigate GI source.  Procedure scheduled at the hospital due to cardiac history and moderate to severe AR.  - Schedule colonoscopy at the hospital. - We have discussed the risks of bleeding, infection, perforation, medication reactions, and remote risk of death associated with colonoscopy. All questions were answered and the patient acknowledges these risk and wishes to proceed.  ADDENDUM 02/27/2014 colonoscopy with Dr. Dyane with [facility] showed sessile polyp proximal colon 4 to 6 mm otherwise unremarkable no high-grade dysplasia Path SS polyp  Aortic aneurysm Aortic dilatation noted. Aneurysm of the aortic root, measuring 47 mm. mild dilatation of the ascending aorta, measuring 41 mm.   Elevated LFTs RUQ normal, no fatty liver, normal gallbladder    Latest Ref Rng & Units 02/28/2024   11:54 AM 01/11/2024    3:51 AM 01/09/2024    4:12 AM  Hepatic Function  Total Protein 6.1 - 8.1 g/dL 7.9  6.6  7.0   Albumin 3.5 - 5.0 g/dL  3.0  3.2   AST 10 - 35 U/L 29  27  25    ALT 9 - 46 U/L 52  51  50   Alk Phosphatase 38 - 126 U/L  55  60   Total Bilirubin 0.2 - 1.2 mg/dL 0.6  0.6  0.7    Platelets 224  INR 01/10/2024 1.1  Recheck with PCP 03/29/2024 AST 27, ALT 35, alk phos 42, TBILI  0.9 During hospitalization for bacteremia and endocarditis Repeat normal Likely secondary to infection/antibiotic  Patient Care Team: Leonel Cole, MD as PCP - General (Family Medicine) Waddell Danelle ORN, MD as PCP - Electrophysiology (Cardiology) Claudene Victory ORN, MD (Inactive) as Consulting Physician (Cardiology)  HISTORY OF PRESENT ILLNESS: 63 y.o. male with a past medical history listed below presents as a new patient for evaluation of colonoscopy.   Discussed the use of AI scribe software for clinical note transcription with the patient, who gave verbal consent to proceed.  History of Present Illness   Robert Madden is a 63 year old male with aortic valve vegetation who presents for evaluation of aortic regurgitation and consideration of a colonoscopy.  In February, he was hospitalized with bacteremia caused by E. faecalis, leading to the discovery of a small vegetation on his aortic valve. He was treated with vancomycin  and another antibiotic for at least two months. Recent blood cultures have been negative, and a transesophageal echocardiogram (TEE) on April 8th showed moderate to severe aortic regurgitation with a small, unchanged mobile density on the aortic valve.  He has no symptoms of aortic regurgitation such as leg swelling, shortness of breath, fatigue, or chest pain. He experienced significant weight loss during his illness but has since regained some weight. He had difficulty exercising and eating during his illness, impacting his weight management.  He  had a colonoscopy approximately ten years ago, but details are unclear, and there is no known family history of colon cancer. He reports regular bowel movements, with occasional difficulty related to fluid intake, and denies any blood in the stool, dark stools, or gastrointestinal symptoms such as heartburn, nausea, or vomiting.  He mentions a potential stress fracture in his left foot, which occasionally causes discomfort but  is not currently painful or swollen. No leg pain or significant swelling.      He  reports that he has never smoked. He has never used smokeless tobacco. He reports that he does not drink alcohol and does not use drugs.  RELEVANT GI HISTORY, IMAGING AND LABS: Results   LABS Blood cultures: Negative ALT: 52  RADIOLOGY Liver ultrasound: No gallstones, no hepatic steatosis  DIAGNOSTIC Transesophageal echocardiogram: Moderate to severe aortic regurgitation, small mobile density on aortic valve (03/07/2024)      CBC    Component Value Date/Time   WBC 4.8 02/28/2024 1154   RBC 5.14 02/28/2024 1154   HGB 14.8 02/28/2024 1154   HCT 44.5 02/28/2024 1154   PLT 224 02/28/2024 1154   MCV 86.6 02/28/2024 1154   MCH 28.8 02/28/2024 1154   MCHC 33.3 02/28/2024 1154   RDW 13.7 02/28/2024 1154   LYMPHSABS 1.5 01/11/2024 0351   MONOABS 0.6 01/11/2024 0351   EOSABS 110 02/28/2024 1154   BASOSABS 38 02/28/2024 1154   Recent Labs    01/07/24 1053 01/08/24 0425 01/09/24 0412 01/11/24 0351 02/28/24 1154  HGB 13.5 12.3* 11.9* 11.6* 14.8    CMP     Component Value Date/Time   NA 136 02/28/2024 1154   K 4.8 02/28/2024 1154   CL 102 02/28/2024 1154   CO2 25 02/28/2024 1154   GLUCOSE 89 02/28/2024 1154   BUN 13 02/28/2024 1154   CREATININE 1.13 02/28/2024 1154   CALCIUM 9.7 02/28/2024 1154   CALCIUM 9.0 01/19/2024 0000   PROT 7.9 02/28/2024 1154   ALBUMIN 3.0 (L) 01/11/2024 0351   AST 29 02/28/2024 1154   ALT 52 (H) 02/28/2024 1154   ALKPHOS 55 01/11/2024 0351   BILITOT 0.6 02/28/2024 1154   GFRNONAA >60 01/11/2024 0351      Latest Ref Rng & Units 02/28/2024   11:54 AM 01/11/2024    3:51 AM 01/09/2024    4:12 AM  Hepatic Function  Total Protein 6.1 - 8.1 g/dL 7.9  6.6  7.0   Albumin 3.5 - 5.0 g/dL  3.0  3.2   AST 10 - 35 U/L 29  27  25    ALT 9 - 46 U/L 52  51  50   Alk Phosphatase 38 - 126 U/L  55  60   Total Bilirubin 0.2 - 1.2 mg/dL 0.6  0.6  0.7       Current  Medications:    Current Outpatient Medications (Cardiovascular):    metoprolol  succinate (TOPROL -XL) 50 MG 24 hr tablet, Take 1 tablet (50 mg total) by mouth daily.   pravastatin  (PRAVACHOL ) 40 MG tablet, Take 1 tablet (40 mg total) by mouth daily.   Current Outpatient Medications (Analgesics):    ibuprofen (ADVIL) 200 MG tablet, Take 400 mg by mouth every 4 (four) hours as needed for headache or mild pain (pain score 1-3).    Medical History:  Past Medical History:  Diagnosis Date   AAA (abdominal aortic aneurysm) (HCC)    Aortic insufficiency    Drug rash 01/30/2024   Enterococcus faecalis infection 01/18/2024  Hx of colonic polyps    Hyperlipidemia    IBS (irritable bowel syndrome)    Rash 01/18/2024   Tachycardia, paroxysmal (HCC)    Allergies:  Allergies  Allergen Reactions   Ampicillin  Rash    Pt describes itchy rash in 4th grade.  No throat swelling or breathing problems.  He then had amoxicillin challenge but developed fulll body rash on high dose ampicillin  + ceftriaxone    Lipitor [Atorvastatin]     joint aches   Zocor [Simvastatin] Other (See Comments)    Joint aches   Honey Rash     Surgical History:  He  has a past surgical history that includes TRANSESOPHAGEAL ECHOCARDIOGRAM (N/A, 01/10/2024) and TRANSESOPHAGEAL ECHOCARDIOGRAM (N/A, 03/07/2024). Family History:  His family history includes AAA (abdominal aortic aneurysm) in his mother; Arthritis in his maternal grandmother; Dementia in his mother; Hypertension in his mother; Leukemia in his paternal grandfather; Skin cancer in his mother.  REVIEW OF SYSTEMS  : All other systems reviewed and negative except where noted in the History of Present Illness.  PHYSICAL EXAM: BP (!) 140/80 (BP Location: Left Arm, Patient Position: Sitting, Cuff Size: Large)   Pulse 68   Ht 5' 10 (1.778 m) Comment: height measured without shoes  Wt 227 lb 4 oz (103.1 kg)   BMI 32.61 kg/m  Physical Exam   GENERAL APPEARANCE:  Well nourished, in no apparent distress HEENT: No cervical lymphadenopathy, unremarkable thyroid, sclerae anicteric, conjunctiva pink RESPIRATORY: Respiratory effort normal, BS equal bilateral without rales, rhonchi, wheezing CARDIO: RRR with no MRGs, peripheral pulses intact ABDOMEN: Soft, non distended, active bowel sounds in all 4 quadrants, no tenderness to palpation, no rebound, no mass appreciated RECTAL: declines MUSCULOSKELETAL: Full ROM, normal gait, without edema SKIN: Dry, intact without rashes or lesions. No jaundice. NEURO: Alert, oriented, no focal deficits PSYCH: Cooperative, normal mood and affect.      Alan JONELLE Coombs, PA-C 11:39 AM

## 2024-07-25 NOTE — Progress Notes (Addendum)
 07/25/2024 Robert Madden [id number] Nov 30, 1961  Referring provider: Leonel Cole, MD Primary GI doctor: Dr. San  ASSESSMENT AND PLAN:  E.Faecalis Bacteremia with endocarditis with CVA Last colonoscopy 10 years ago possibly at [facility], can try to get records, recall was 5 years which patient missed, no family history of colon cancer S/p treatment with ID x 2 months IV ABX and following cardiology Repeat blood cultures negative Repeat TEE after treatment on 03/07/24 showed EF 60-65%, moderate to severe AR with small mobile echodensity on non coronary cusp Saw Dr. Lucas on 05/03/24, feels no immediate need for surgery, no symptoms of AR at this time and states to proceed with colonoscopy to investigate GI source.  Procedure scheduled at the hospital due to cardiac history and moderate to severe AR.  - Schedule colonoscopy at the hospital. - We have discussed the risks of bleeding, infection, perforation, medication reactions, and remote risk of death associated with colonoscopy. All questions were answered and the patient acknowledges these risk and wishes to proceed.  ADDENDUM 02/27/2014 colonoscopy with Dr. Dyane with [facility] showed sessile polyp proximal colon 4 to 6 mm otherwise unremarkable no high-grade dysplasia Path SS polyp  Aortic aneurysm Aortic dilatation noted. Aneurysm of the aortic root, measuring 47 mm. mild dilatation of the ascending aorta, measuring 41 mm.   Elevated LFTs RUQ normal, no fatty liver, normal gallbladder    Latest Ref Rng & Units 02/28/2024   11:54 AM 01/11/2024    3:51 AM 01/09/2024    4:12 AM  Hepatic Function  Total Protein 6.1 - 8.1 g/dL 7.9  6.6  7.0   Albumin 3.5 - 5.0 g/dL  3.0  3.2   AST 10 - 35 U/L 29  27  25    ALT 9 - 46 U/L 52  51  50   Alk Phosphatase 38 - 126 U/L  55  60   Total Bilirubin 0.2 - 1.2 mg/dL 0.6  0.6  0.7    Platelets 224  INR 01/10/2024 1.1  Recheck with PCP 03/29/2024 AST 27, ALT 35, alk phos 42, TBILI  0.9 During hospitalization for bacteremia and endocarditis Repeat normal Likely secondary to infection/antibiotic  Patient Care Team: Leonel Cole, MD as PCP - General (Family Medicine) Waddell Danelle ORN, MD as PCP - Electrophysiology (Cardiology) Claudene Victory ORN, MD (Inactive) as Consulting Physician (Cardiology)  HISTORY OF PRESENT ILLNESS: 63 y.o. male with a past medical history listed below presents as a new patient for evaluation of colonoscopy.   Discussed the use of AI scribe software for clinical note transcription with the patient, who gave verbal consent to proceed.  History of Present Illness   Robert Madden is a 63 year old male with aortic valve vegetation who presents for evaluation of aortic regurgitation and consideration of a colonoscopy.  In February, he was hospitalized with bacteremia caused by E. faecalis, leading to the discovery of a small vegetation on his aortic valve. He was treated with vancomycin  and another antibiotic for at least two months. Recent blood cultures have been negative, and a transesophageal echocardiogram (TEE) on April 8th showed moderate to severe aortic regurgitation with a small, unchanged mobile density on the aortic valve.  He has no symptoms of aortic regurgitation such as leg swelling, shortness of breath, fatigue, or chest pain. He experienced significant weight loss during his illness but has since regained some weight. He had difficulty exercising and eating during his illness, impacting his weight management.  He  had a colonoscopy approximately ten years ago, but details are unclear, and there is no known family history of colon cancer. He reports regular bowel movements, with occasional difficulty related to fluid intake, and denies any blood in the stool, dark stools, or gastrointestinal symptoms such as heartburn, nausea, or vomiting.  He mentions a potential stress fracture in his left foot, which occasionally causes discomfort but  is not currently painful or swollen. No leg pain or significant swelling.      He  reports that he has never smoked. He has never used smokeless tobacco. He reports that he does not drink alcohol and does not use drugs.  RELEVANT GI HISTORY, IMAGING AND LABS: Results   LABS Blood cultures: Negative ALT: 52  RADIOLOGY Liver ultrasound: No gallstones, no hepatic steatosis  DIAGNOSTIC Transesophageal echocardiogram: Moderate to severe aortic regurgitation, small mobile density on aortic valve (03/07/2024)      CBC    Component Value Date/Time   WBC 4.8 02/28/2024 1154   RBC 5.14 02/28/2024 1154   HGB 14.8 02/28/2024 1154   HCT 44.5 02/28/2024 1154   PLT 224 02/28/2024 1154   MCV 86.6 02/28/2024 1154   MCH 28.8 02/28/2024 1154   MCHC 33.3 02/28/2024 1154   RDW 13.7 02/28/2024 1154   LYMPHSABS 1.5 01/11/2024 0351   MONOABS 0.6 01/11/2024 0351   EOSABS 110 02/28/2024 1154   BASOSABS 38 02/28/2024 1154   Recent Labs    01/07/24 1053 01/08/24 0425 01/09/24 0412 01/11/24 0351 02/28/24 1154  HGB 13.5 12.3* 11.9* 11.6* 14.8    CMP     Component Value Date/Time   NA 136 02/28/2024 1154   K 4.8 02/28/2024 1154   CL 102 02/28/2024 1154   CO2 25 02/28/2024 1154   GLUCOSE 89 02/28/2024 1154   BUN 13 02/28/2024 1154   CREATININE 1.13 02/28/2024 1154   CALCIUM 9.7 02/28/2024 1154   CALCIUM 9.0 01/19/2024 0000   PROT 7.9 02/28/2024 1154   ALBUMIN 3.0 (L) 01/11/2024 0351   AST 29 02/28/2024 1154   ALT 52 (H) 02/28/2024 1154   ALKPHOS 55 01/11/2024 0351   BILITOT 0.6 02/28/2024 1154   GFRNONAA >60 01/11/2024 0351      Latest Ref Rng & Units 02/28/2024   11:54 AM 01/11/2024    3:51 AM 01/09/2024    4:12 AM  Hepatic Function  Total Protein 6.1 - 8.1 g/dL 7.9  6.6  7.0   Albumin 3.5 - 5.0 g/dL  3.0  3.2   AST 10 - 35 U/L 29  27  25    ALT 9 - 46 U/L 52  51  50   Alk Phosphatase 38 - 126 U/L  55  60   Total Bilirubin 0.2 - 1.2 mg/dL 0.6  0.6  0.7       Current  Medications:    Current Outpatient Medications (Cardiovascular):    metoprolol  succinate (TOPROL -XL) 50 MG 24 hr tablet, Take 1 tablet (50 mg total) by mouth daily.   pravastatin  (PRAVACHOL ) 40 MG tablet, Take 1 tablet (40 mg total) by mouth daily.   Current Outpatient Medications (Analgesics):    ibuprofen (ADVIL) 200 MG tablet, Take 400 mg by mouth every 4 (four) hours as needed for headache or mild pain (pain score 1-3).    Medical History:  Past Medical History:  Diagnosis Date   AAA (abdominal aortic aneurysm) (HCC)    Aortic insufficiency    Drug rash 01/30/2024   Enterococcus faecalis infection 01/18/2024  Hx of colonic polyps    Hyperlipidemia    IBS (irritable bowel syndrome)    Rash 01/18/2024   Tachycardia, paroxysmal (HCC)    Allergies:  Allergies  Allergen Reactions   Ampicillin  Rash    Pt describes itchy rash in 4th grade.  No throat swelling or breathing problems.  He then had amoxicillin challenge but developed fulll body rash on high dose ampicillin  + ceftriaxone    Lipitor [Atorvastatin]     joint aches   Zocor [Simvastatin] Other (See Comments)    Joint aches   Honey Rash     Surgical History:  He  has a past surgical history that includes TRANSESOPHAGEAL ECHOCARDIOGRAM (N/A, 01/10/2024) and TRANSESOPHAGEAL ECHOCARDIOGRAM (N/A, 03/07/2024). Family History:  His family history includes AAA (abdominal aortic aneurysm) in his mother; Arthritis in his maternal grandmother; Dementia in his mother; Hypertension in his mother; Leukemia in his paternal grandfather; Skin cancer in his mother.  REVIEW OF SYSTEMS  : All other systems reviewed and negative except where noted in the History of Present Illness.  PHYSICAL EXAM: BP (!) 140/80 (BP Location: Left Arm, Patient Position: Sitting, Cuff Size: Large)   Pulse 68   Ht 5' 10 (1.778 m) Comment: height measured without shoes  Wt 227 lb 4 oz (103.1 kg)   BMI 32.61 kg/m  Physical Exam   GENERAL APPEARANCE:  Well nourished, in no apparent distress HEENT: No cervical lymphadenopathy, unremarkable thyroid, sclerae anicteric, conjunctiva pink RESPIRATORY: Respiratory effort normal, BS equal bilateral without rales, rhonchi, wheezing CARDIO: RRR with no MRGs, peripheral pulses intact ABDOMEN: Soft, non distended, active bowel sounds in all 4 quadrants, no tenderness to palpation, no rebound, no mass appreciated RECTAL: declines MUSCULOSKELETAL: Full ROM, normal gait, without edema SKIN: Dry, intact without rashes or lesions. No jaundice. NEURO: Alert, oriented, no focal deficits PSYCH: Cooperative, normal mood and affect.      Alan JONELLE Coombs, PA-C 11:39 AM

## 2024-08-09 ENCOUNTER — Encounter (HOSPITAL_COMMUNITY): Payer: Self-pay | Admitting: Gastroenterology

## 2024-08-09 ENCOUNTER — Telehealth: Payer: Self-pay

## 2024-08-09 NOTE — Telephone Encounter (Signed)
 Procedure:COLON Procedure date: 08/15/24 Procedure location: [facility] Arrival Time: 11:30 Spoke with the patient Y/N: Y Any prep concerns? N  Has the patient obtained the prep from the pharmacy ? Y Do you have a care partner and transportation: Y Any additional concerns? N

## 2024-08-09 NOTE — Progress Notes (Signed)
 Attempted to obtain medical history for pre op call via telephone, unable to reach at this time. HIPAA compliant voicemail message left requesting return call to pre surgical testing department.

## 2024-08-15 ENCOUNTER — Ambulatory Visit (HOSPITAL_COMMUNITY)
Admission: RE | Admit: 2024-08-15 | Discharge: 2024-08-15 | Disposition: A | Discharge status: Home / Self Care | Attending: Gastroenterology | Admitting: Gastroenterology

## 2024-08-15 ENCOUNTER — Ambulatory Visit (HOSPITAL_COMMUNITY): Admitting: Anesthesiology

## 2024-08-15 ENCOUNTER — Ambulatory Visit (HOSPITAL_BASED_OUTPATIENT_CLINIC_OR_DEPARTMENT_OTHER): Admitting: Anesthesiology

## 2024-08-15 ENCOUNTER — Other Ambulatory Visit: Payer: Self-pay

## 2024-08-15 ENCOUNTER — Encounter (HOSPITAL_COMMUNITY): Payer: Self-pay | Admitting: Gastroenterology

## 2024-08-15 ENCOUNTER — Encounter (HOSPITAL_COMMUNITY)
Admission: RE | Disposition: A | Discharge status: Home / Self Care | Payer: Self-pay | Source: Home / Self Care | Attending: Gastroenterology

## 2024-08-15 DIAGNOSIS — Q2543 Congenital aneurysm of aorta: Secondary | ICD-10-CM | POA: Insufficient documentation

## 2024-08-15 DIAGNOSIS — Z1211 Encounter for screening for malignant neoplasm of colon: Secondary | ICD-10-CM

## 2024-08-15 DIAGNOSIS — D120 Benign neoplasm of cecum: Secondary | ICD-10-CM | POA: Insufficient documentation

## 2024-08-15 DIAGNOSIS — K648 Other hemorrhoids: Secondary | ICD-10-CM | POA: Diagnosis not present

## 2024-08-15 DIAGNOSIS — D124 Benign neoplasm of descending colon: Secondary | ICD-10-CM | POA: Diagnosis not present

## 2024-08-15 DIAGNOSIS — R7989 Other specified abnormal findings of blood chemistry: Secondary | ICD-10-CM | POA: Insufficient documentation

## 2024-08-15 DIAGNOSIS — K635 Polyp of colon: Secondary | ICD-10-CM | POA: Insufficient documentation

## 2024-08-15 DIAGNOSIS — E785 Hyperlipidemia, unspecified: Secondary | ICD-10-CM | POA: Diagnosis not present

## 2024-08-15 DIAGNOSIS — A498 Other bacterial infections of unspecified site: Secondary | ICD-10-CM

## 2024-08-15 DIAGNOSIS — D122 Benign neoplasm of ascending colon: Secondary | ICD-10-CM | POA: Diagnosis not present

## 2024-08-15 DIAGNOSIS — Z8679 Personal history of other diseases of the circulatory system: Secondary | ICD-10-CM | POA: Diagnosis not present

## 2024-08-15 DIAGNOSIS — Z8673 Personal history of transient ischemic attack (TIA), and cerebral infarction without residual deficits: Secondary | ICD-10-CM | POA: Insufficient documentation

## 2024-08-15 DIAGNOSIS — K640 First degree hemorrhoids: Secondary | ICD-10-CM

## 2024-08-15 DIAGNOSIS — I351 Nonrheumatic aortic (valve) insufficiency: Secondary | ICD-10-CM | POA: Diagnosis not present

## 2024-08-15 DIAGNOSIS — I358 Other nonrheumatic aortic valve disorders: Secondary | ICD-10-CM | POA: Diagnosis not present

## 2024-08-15 DIAGNOSIS — D125 Benign neoplasm of sigmoid colon: Secondary | ICD-10-CM

## 2024-08-15 HISTORY — PX: COLONOSCOPY: SHX5424

## 2024-08-15 SURGERY — COLONOSCOPY
Anesthesia: Monitor Anesthesia Care

## 2024-08-15 MED ORDER — PROPOFOL 1000 MG/100ML IV EMUL
INTRAVENOUS | Status: AC
  Filled 2024-08-15: qty 100

## 2024-08-15 MED ORDER — SODIUM CHLORIDE 0.9 % IV SOLN: INTRAVENOUS | Status: DC

## 2024-08-15 MED ORDER — ONDANSETRON HCL 4 MG/2ML IJ SOLN
INTRAMUSCULAR | Status: DC | PRN
Start: 2024-08-15 — End: 2024-08-15
  Administered 2024-08-15: 4 mg via INTRAVENOUS

## 2024-08-15 MED ORDER — PROPOFOL 500 MG/50ML IV EMUL
INTRAVENOUS | Status: DC | PRN
  Administered 2024-08-15: 20 mg via INTRAVENOUS
  Administered 2024-08-15: 50 mg via INTRAVENOUS
  Administered 2024-08-15: 150 ug/kg/min via INTRAVENOUS

## 2024-08-15 NOTE — Interval H&P Note (Signed)
 History and Physical Interval Note:  08/15/2024 1:20 PM  Robert Madden  has presented today for surgery, with the diagnosis of Enterococcus faecalis (A49.8), Aortic Valve endocarditis (I35.8).  The various methods of treatment have been discussed with the patient and family. After consideration of risks, benefits and other options for treatment, the patient has consented to  Procedure(s): COLONOSCOPY (N/A) as a surgical intervention.  The patient's history has been reviewed, patient examined, no change in status, stable for surgery.  I have reviewed the patient's chart and labs.  Questions were answered to the patient's satisfaction.     Sandor GAILS Robert Madden

## 2024-08-15 NOTE — Anesthesia Postprocedure Evaluation (Signed)
 Anesthesia Post Note  Patient: Robert Madden  Procedure(s) Performed: COLONOSCOPY     Patient location during evaluation: PACU Anesthesia Type: MAC Level of consciousness: awake and alert Pain management: pain level controlled Vital Signs Assessment: post-procedure vital signs reviewed and stable Respiratory status: spontaneous breathing, nonlabored ventilation and respiratory function stable Cardiovascular status: blood pressure returned to baseline and stable Postop Assessment: no apparent nausea or vomiting Anesthetic complications: no   No notable events documented.  Last Vitals:  Vitals:   08/15/24 1505 08/15/24 1510  BP: (!) 148/94 (!) 159/93  Pulse: 62 61  Resp: 12 15  Temp:    SpO2: 99% 100%    Last Pain:  Vitals:   08/15/24 1510  TempSrc:   PainSc: 0-No pain                 Almarie CHRISTELLA Marchi

## 2024-08-15 NOTE — Transfer of Care (Signed)
 Immediate Anesthesia Transfer of Care Note  Patient: Robert Madden  Procedure(s) Performed: COLONOSCOPY  Patient Location: PACU  Anesthesia Type:MAC  Level of Consciousness: drowsy  Airway & Oxygen Therapy: Patient Spontanous Breathing and Patient connected to face mask oxygen  Post-op Assessment: Report given to RN and Post -op Vital signs reviewed and stable  Post vital signs: Reviewed and stable  Last Vitals:  Vitals Value Taken Time  BP    Temp    Pulse 60 08/15/24 14:55  Resp 12 08/15/24 14:55  SpO2 100 % 08/15/24 14:55  Vitals shown include unfiled device data.  Last Pain:  Vitals:   08/15/24 1254  TempSrc: Temporal  PainSc: 0-No pain      Patients Stated Pain Goal: 0 (08/15/24 1254)  Complications: No notable events documented.

## 2024-08-15 NOTE — Anesthesia Procedure Notes (Signed)
 Procedure Name: MAC Date/Time: 08/15/2024 2:20 PM  Performed by: Joshua Vernell BROCKS, CRNAPre-anesthesia Checklist: Patient identified, Emergency Drugs available, Suction available and Patient being monitored Patient Re-evaluated:Patient Re-evaluated prior to induction Oxygen Delivery Method: Simple face mask Placement Confirmation: positive ETCO2 and breath sounds checked- equal and bilateral Dental Injury: Teeth and Oropharynx as per pre-operative assessment

## 2024-08-15 NOTE — Progress Notes (Signed)
 Agree with the assessment and plan as outlined by Quentin Mulling, PA-C. ? ?Keron Neenan, DO, FACG ? ?

## 2024-08-15 NOTE — Op Note (Signed)
 [facility] Patient Name: Robert Madden Procedure Date: 08/15/2024 MRN: [id number] Attending MD: Sandor Flatter , MD, [id number] Date of Birth: 1960-12-13 CSN: [id number] Age: 63 Admit Type: Outpatient Procedure:                Colonoscopy Indications:              Screening for colorectal malignant neoplasm (last                            colonoscopy was more than 10 years ago)                           Hospital admission in 02/2024 with E faecalis                            bacteremia with endocarditis and CVA. Has completed                            prolonged antibiotics with follow-up in the ID                            clinic. Was seen in the CT surgery clinic with no                            immediate need for surgery and recommendation for                            colonoscopy first before surgery for his moderate                            to severe aortic regurgitation. Last colonoscopy                            was more than 10 years ago and notable for polyps                            per patient. Otherwise no lower GI symptoms. Providers:                Sandor Flatter, MD, Jacquelyn Jaci Pierce, RN,                            Farris Southgate, Technician Referring MD:              Medicines:                Monitored Anesthesia Care Complications:            No immediate complications. Estimated Blood Loss:     Estimated blood loss was minimal. Procedure:                Pre-Anesthesia Assessment:                           - Prior to the procedure, a History and Physical  was performed, and patient medications and                            allergies were reviewed. The patient's tolerance of                            previous anesthesia was also reviewed. The risks                            and benefits of the procedure and the sedation                            options and risks were discussed with the patient.                             All questions were answered, and informed consent                            was obtained. Prior Anticoagulants: The patient has                            taken no anticoagulant or antiplatelet agents. ASA                            Grade Assessment: III - A patient with severe                            systemic disease. After reviewing the risks and                            benefits, the patient was deemed in satisfactory                            condition to undergo the procedure.                           After obtaining informed consent, the colonoscope                            was passed under direct vision. Throughout the                            procedure, the patient's blood pressure, pulse, and                            oxygen saturations were monitored continuously. The                            CF-HQ190L ([id number] Olympus colonoscope was                            introduced through the anus and advanced to the the  terminal ileum. The colonoscopy was performed                            without difficulty. The patient tolerated the                            procedure well. The quality of the bowel                            preparation was good. The terminal ileum, ileocecal                            valve, appendiceal orifice, and rectum were                            photographed. Scope In: 2:25:45 PM Scope Out: 2:45:37 PM Scope Withdrawal Time: 0 hours 17 minutes 37 seconds  Total Procedure Duration: 0 hours 19 minutes 52 seconds  Findings:      The perianal and digital rectal examinations were normal.      Five sessile polyps were found in the proximal ascending colon (1) and       cecum (4). The polyps were 2 to 5 mm in size. These polyps were removed       with a cold snare. Resection and retrieval were complete. Estimated       blood loss was minimal.      A 5 mm polyp was found in the descending colon. The  polyp was sessile.       The polyp was removed with a cold snare. Resection and retrieval were       complete. Estimated blood loss was minimal.      Two sessile polyps were found in the sigmoid colon. The polyps were 2 to       3 mm in size. These polyps were removed with a cold snare. Resection and       retrieval were complete. Estimated blood loss was minimal.      Non-bleeding internal hemorrhoids were found during retroflexion. The       hemorrhoids were small.      The terminal ileum appeared normal. Impression:               - Five 2 to 5 mm polyps in the proximal ascending                            colon and in the cecum, removed with a cold snare.                            Resected and retrieved.                           - One 5 mm polyp in the descending colon, removed                            with a cold snare. Resected and retrieved.                           - Two  2 to 3 mm polyps in the sigmoid colon,                            removed with a cold snare. Resected and retrieved.                           - Non-bleeding internal hemorrhoids.                           - The examined portion of the ileum was normal. Moderate Sedation:      Not Applicable - Patient had care per Anesthesia. Recommendation:           - Patient has a contact number available for                            emergencies. The signs and symptoms of potential                            delayed complications were discussed with the                            patient. Return to normal activities tomorrow.                            Written discharge instructions were provided to the                            patient.                           - Resume previous diet.                           - Continue present medications.                           - Await pathology results.                           - Repeat colonoscopy for surveillance based on                            pathology results.                            - Return to GI office PRN. Procedure Code(s):        --- Professional ---                           [phone], Colonoscopy, flexible; with removal of                            tumor(s), polyp(s), or other lesion(s) by snare                            technique Diagnosis Code(s):        ---  Professional ---                           Z12.11, Encounter for screening for malignant                            neoplasm of colon                           D12.2, Benign neoplasm of ascending colon                           D12.0, Benign neoplasm of cecum                           D12.5, Benign neoplasm of sigmoid colon                           D12.4, Benign neoplasm of descending colon                           K64.8, Other hemorrhoids CPT copyright 2022 American Medical Association. All rights reserved. The codes documented in this report are preliminary and upon coder review may  be revised to meet current compliance requirements. Sandor Flatter, MD 08/15/2024 2:59:53 PM Number of Addenda: 0

## 2024-08-15 NOTE — Discharge Instructions (Signed)

## 2024-08-15 NOTE — Anesthesia Preprocedure Evaluation (Addendum)
 Anesthesia Evaluation  Patient identified by MRN, date of birth, ID band Patient awake    Reviewed: Allergy & Precautions, NPO status , Patient's Chart, lab work & pertinent test results  Airway Mallampati: I  TM Distance: >3 FB Neck ROM: Full    Dental no notable dental hx. (+) Caps, Dental Advisory Given, Teeth Intact   Pulmonary neg pulmonary ROS   Pulmonary exam normal breath sounds clear to auscultation       Cardiovascular Normal cardiovascular exam+ Valvular Problems/Murmurs AI  Rhythm:Regular Rate:Normal  Hx/o enterococcus faecalis AV endocarditis  TEE 03/07/24 1. Left ventricular ejection fraction, by estimation, is 60 to 65%. The  left ventricle has normal function.   2. Right ventricular systolic function is normal. The right ventricular  size is normal.   3. No left atrial/left atrial appendage thrombus was detected. The LAA  emptying velocity was 78 cm/s.   4. The mitral valve is normal in structure. Mild to moderate mitral valve  regurgitation. No evidence of mitral stenosis.   5. There is a small ( 0.57 cm x 0.22 cm) mobile echodensity on the  noncoronary cusp- this was present on prior TEE with no change in size.  The aortic valve is abnormal. Aortic valve regurgitation is moderate to  severe. No aortic stenosis is present.   6. Aortic dilatation noted. Aneurysm of the aortic root, measuring 47 mm.  There is mild dilatation of the ascending aorta, measuring 41 mm.   7. 3D performed of the aortic valve and demonstrates evaluation of aortic  valve.   EKG 03/17/24 NSR, Normal   Neuro/Psych negative neurological ROS  negative psych ROS   GI/Hepatic negative GI ROS, Neg liver ROS,,,  Endo/Other  HLD  Renal/GU negative Renal ROS  negative genitourinary   Musculoskeletal negative musculoskeletal ROS (+)    Abdominal Normal abdominal exam  (+)   Peds  Hematology negative hematology ROS (+)   Anesthesia  Other Findings   Reproductive/Obstetrics                              Anesthesia Physical Anesthesia Plan  ASA: 3  Anesthesia Plan: MAC   Post-op Pain Management: Minimal or no pain anticipated   Induction: Intravenous  PONV Risk Score and Plan: 2 and Treatment may vary due to age or medical condition and Propofol  infusion  Airway Management Planned: Natural Airway, Nasal Cannula and Simple Face Mask  Additional Equipment: None  Intra-op Plan:   Post-operative Plan:   Informed Consent: I have reviewed the patients History and Physical, chart, labs and discussed the procedure including the risks, benefits and alternatives for the proposed anesthesia with the patient or authorized representative who has indicated his/her understanding and acceptance.     Dental advisory given  Plan Discussed with: Anesthesiologist and CRNA  Anesthesia Plan Comments:          Anesthesia Quick Evaluation

## 2024-08-15 NOTE — H&P (View-Only) (Signed)
 Agree with the assessment and plan as outlined by Quentin Mulling, PA-C. ? ?Keron Neenan, DO, FACG ? ?

## 2024-08-16 ENCOUNTER — Encounter (HOSPITAL_COMMUNITY): Payer: Self-pay | Admitting: Gastroenterology

## 2024-08-16 LAB — SURGICAL PATHOLOGY

## 2024-08-16 NOTE — Progress Notes (Signed)
 Lvm

## 2024-08-22 ENCOUNTER — Ambulatory Visit: Payer: Self-pay | Admitting: Gastroenterology

## 2024-08-22 NOTE — Interval H&P Note (Signed)
 History and Physical Interval Note:  08/15/2024 1:20 PM  Robert Madden  has presented today for surgery, with the diagnosis of Enterococcus faecalis (A49.8), Aortic Valve endocarditis (I35.8).  The various methods of treatment have been discussed with the patient and family. After consideration of risks, benefits and other options for treatment, the patient has consented to  Procedure(s): COLONOSCOPY (N/A) as a surgical intervention.  The patient's history has been reviewed, patient examined, no change in status, stable for surgery.  I have reviewed the patient's chart and labs.  Questions were answered to the patient's satisfaction.     Sandor GAILS Clois Montavon

## 2024-10-10 ENCOUNTER — Other Ambulatory Visit: Payer: Self-pay | Admitting: Surgery

## 2024-10-10 DIAGNOSIS — I358 Other nonrheumatic aortic valve disorders: Secondary | ICD-10-CM

## 2024-10-10 DIAGNOSIS — I351 Nonrheumatic aortic (valve) insufficiency: Secondary | ICD-10-CM

## 2024-10-10 DIAGNOSIS — I7121 Aneurysm of the ascending aorta, without rupture: Secondary | ICD-10-CM

## 2024-10-27 ENCOUNTER — Ambulatory Visit (HOSPITAL_COMMUNITY)
Admission: RE | Admit: 2024-10-27 | Discharge: 2024-10-27 | Disposition: A | Discharge status: Home / Self Care | Source: Ambulatory Visit | Attending: Surgery | Admitting: Surgery

## 2024-10-27 DIAGNOSIS — I358 Other nonrheumatic aortic valve disorders: Secondary | ICD-10-CM | POA: Diagnosis present

## 2024-10-27 DIAGNOSIS — I351 Nonrheumatic aortic (valve) insufficiency: Secondary | ICD-10-CM | POA: Insufficient documentation

## 2024-10-27 DIAGNOSIS — I7121 Aneurysm of the ascending aorta, without rupture: Secondary | ICD-10-CM | POA: Diagnosis present

## 2024-10-27 MED ORDER — IOHEXOL 350 MG/ML SOLN
75.0000 mL | Freq: Once | INTRAVENOUS | Status: AC | PRN
  Administered 2024-10-27: 75 mL via INTRAVENOUS

## 2024-11-06 ENCOUNTER — Ambulatory Visit (HOSPITAL_COMMUNITY)
Admission: RE | Admit: 2024-11-06 | Discharge: 2024-11-06 | Disposition: A | Source: Ambulatory Visit | Attending: Surgery | Admitting: Surgery

## 2024-11-06 DIAGNOSIS — I351 Nonrheumatic aortic (valve) insufficiency: Secondary | ICD-10-CM

## 2024-11-06 DIAGNOSIS — I7121 Aneurysm of the ascending aorta, without rupture: Secondary | ICD-10-CM

## 2024-11-06 DIAGNOSIS — I358 Other nonrheumatic aortic valve disorders: Secondary | ICD-10-CM

## 2024-11-06 LAB — ECHOCARDIOGRAM COMPLETE
Area-P 1/2: 3.01 cm2
P 1/2 time: 484 ms
S' Lateral: 2.8 cm

## 2024-11-08 ENCOUNTER — Ambulatory Visit: Attending: Surgery | Admitting: Surgery

## 2024-11-08 ENCOUNTER — Encounter: Payer: Self-pay | Admitting: Surgery

## 2024-11-08 VITALS — BP 124/77 | HR 74 | Resp 20 | Ht 72.0 in | Wt 215.0 lb

## 2024-11-08 DIAGNOSIS — I7121 Aneurysm of the ascending aorta, without rupture: Secondary | ICD-10-CM | POA: Diagnosis not present

## 2024-11-08 DIAGNOSIS — I351 Nonrheumatic aortic (valve) insufficiency: Secondary | ICD-10-CM

## 2024-11-08 NOTE — Progress Notes (Signed)
 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  Previous echocardiogram in 2016 showed a normal trileaflet aortic valve with mild insufficiency.  I last saw him in January 2024. 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  2D echocardiogram on 01/07/2024 showed a small mobile echodensity on the noncoronary cusp of the valve with mild insufficiency.  TEE on 01/10/2024 showed a 0.5 cm echodensity on the noncoronary cusp of the aortic valve concerning for vegetation with moderate aortic insufficiency.  The aortic root was measured at 4.6 cm with an ascending aorta of 4.2 cm.  Left ventricular ejection fraction was 60 to 65%.  A follow-up echocardiogram on 02/23/2024 showed a persistent small mobile density on the noncoronary cusp with moderate aortic insufficiency with pressure half-time of 453 ms.  A repeat TEE on 03/07/2024 showed persistence of this small mobile echodensity on the noncoronary cusp which was unchanged in size.  There was felt to be moderate to severe aortic insufficiency.  The aortic root was measured at 4.7 cm.  Left ventricular ejection fraction was 60 to 65%.   An MRI of the brain in February when he initially presented with endocarditis showed a single punctate focus of diffusion signal abnormality in the high left frontal lobe suspicious for a tiny acute to subacute ischemic infarct.  It was felt that this was probably not embolic. He completed a 6-week course of antibiotics in March and subsequent cultures have been negative.  It was recommended that he have a colonoscopy performed given the Enterococcus bacteremia to look for any potential sources.  He underwent colonoscopy on 08/15/2024 which showed several small colon polyps which were removed  and were nonmalignant.  He said that he feels well without chest pain or shortness of breath.  He exercises every day and his stamina has been good.  He denies any peripheral edema. Current Outpatient Medications  Medication Sig Dispense Refill   ibuprofen (ADVIL) 200 MG tablet Take 400 mg by mouth every 4 (four) hours as needed for headache or mild pain (pain score 1-3).     metoprolol  succinate (TOPROL -XL) 50 MG 24 hr tablet Take 1 tablet (50 mg total) by mouth daily. 90 tablet 3   pravastatin  (PRAVACHOL ) 40 MG tablet Take 1 tablet (40 mg total) by mouth daily. 90 tablet 3   No current facility-administered medications for this visit.     Physical Exam: BP 124/77   Pulse 74   Resp 20   Ht 6' (1.829 m)   Wt 215 lb (97.5 kg)   SpO2 98% Comment: RA  BMI 29.16 kg/m  He looks well. Cardiac exam shows a regular rate and rhythm with normal heart sounds.  There is no murmur. Lungs are clear. There is no peripheral edema  Diagnostic Tests:  ECHOCARDIOGRAM REPORT       Patient Name:   Robert Madden Date of Exam: 11/06/2024  Medical Rec #:  [id number]         Height:       72.0 in  Accession #:    [id number]        Weight:       217.0 lb  Date  of Birth:  [birth year]         BSA:          2.206 m  Patient Age:    63 years          BP:           133/85 mmHg  Patient Gender: M                 HR:           69 bpm.  Exam Location:  [address]   Procedure: 2D Echo, Cardiac Doppler, Color Doppler and Strain Analysis  (Both            Spectral and Color Flow Doppler were utilized during  procedure).   Indications:   I71 Ascending aortic aneurysm    History:        Patient has prior history of Echocardiogram examinations,  most                 recent 02/23/2024. Ascending aortic aneurysm, Endocarditis,                  Arrythmias:SVT; Risk Factors:Dyslipidemia.    Sonographer:    Robert Madden RDCS  Referring Phys: [id number] Robert Madden K Robert Madden   IMPRESSIONS     1. Left  ventricular ejection fraction, by estimation, is 60 to 65%. The  left ventricle has normal function. The left ventricle has no regional  wall motion abnormalities. There is moderate asymmetric left ventricular  hypertrophy of the basal-septal  segment. Left ventricular diastolic parameters were normal. The average  left ventricular global longitudinal strain is -22.7 %. The global  longitudinal strain is normal.   2. Right ventricular systolic function is normal. The right ventricular  size is normal.   3. The mitral valve is normal in structure. Mild mitral valve  regurgitation. No evidence of mitral stenosis.   4. The aortic valve is tricuspid. There is mild calcification of the  aortic valve. Aortic valve regurgitation is moderate. Aortic valve  sclerosis/calcification is present, without any evidence of aortic  stenosis. Aortic regurgitation PHT measures 484  msec.   5. Aortic dilatation noted. There is mild dilatation of the aortic root,  measuring 44 mm. There is mild dilatation of the ascending aorta,  measuring 41 mm.   FINDINGS   Left Ventricle: Left ventricular ejection fraction, by estimation, is 60  to 65%. The left ventricle has normal function. The left ventricle has no  regional wall motion abnormalities. The average left ventricular global  longitudinal strain is -22.7 %.  Strain was performed and the global longitudinal strain is normal. The  left ventricular internal cavity size was normal in size. There is  moderate asymmetric left ventricular hypertrophy of the basal-septal  segment. Left ventricular diastolic parameters  were normal.   Right Ventricle: The right ventricular size is normal. No increase in  right ventricular wall thickness. Right ventricular systolic function is  normal.   Left Atrium: Left atrial size was normal in size.   Right Atrium: Right atrial size was normal in size.   Pericardium: There is no evidence of pericardial effusion.    Mitral Valve: The mitral valve is normal in structure. Mild mitral valve  regurgitation. No evidence of mitral valve stenosis.   Tricuspid Valve: The tricuspid valve is normal in structure. Tricuspid  valve regurgitation is trivial. No evidence of tricuspid stenosis.   Aortic Valve: The aortic valve is tricuspid. There is mild calcification  of the aortic valve. Aortic valve regurgitation is moderate. Aortic  regurgitation PHT measures 484 msec. Aortic valve sclerosis/calcification  is present, without any evidence of  aortic stenosis.   Pulmonic Valve: The pulmonic valve was normal in structure. Pulmonic valve  regurgitation is trivial. No evidence of pulmonic stenosis.   Aorta: Aortic dilatation noted. There is mild dilatation of the aortic  root, measuring 44 mm. There is mild dilatation of the ascending aorta,  measuring 41 mm.   Venous: The inferior vena cava was not well visualized.   IAS/Shunts: No atrial level shunt detected by color flow Doppler.     LEFT VENTRICLE  PLAX 2D  LVIDd:         4.30 cm   Diastology  LVIDs:         2.80 cm   LV e' medial:    6.42 cm/s  LV PW:         0.90 cm   LV E/e' medial:  14.8  LV IVS:        1.80 cm   LV e' lateral:   10.00 cm/s  LVOT diam:     2.30 cm   LV E/e' lateral: 9.5  LV SV:         130  LV SV Index:   59        2D Longitudinal Strain  LVOT Area:     4.15 cm  2D Strain GLS Avg:     -22.7 %     RIGHT VENTRICLE  RV S prime:     14.80 cm/s  PULMONARY VEINS  TAPSE (M-mode): 2.4 cm      Diastolic Velocity: 51.10 cm/s  RVSP:           21.3 mmHg   S/D Velocity:       1.20                              Systolic Velocity:  60.90 cm/s   LEFT ATRIUM             Index        RIGHT ATRIUM           Index  LA diam:        4.00 cm 1.81 cm/m   RA Pressure: 3.00 mmHg  LA Vol (A2C):   50.4 ml 22.85 ml/m  RA Area:     14.40 cm  LA Vol (A4C):   39.4 ml 17.86 ml/m  RA Volume:   32.50 ml  14.73 ml/m  LA Biplane Vol: 46.3 ml 20.99  ml/m   AORTIC VALVE  LVOT Vmax:   142.00 cm/s  LVOT Vmean:  96.200 cm/s  LVOT VTI:    0.312 m  AI PHT:      484 msec    AORTA  Ao Root diam: 4.40 cm  Ao Asc diam:  4.10 cm   MITRAL VALVE               TRICUSPID VALVE  MV Area (PHT): 3.01 cm    TR Peak grad:   18.3 mmHg  MV Decel Time: 252 msec    TR Vmax:        214.00 cm/s  MV E velocity: 94.90 cm/s  Estimated RAP:  3.00 mmHg  MV A velocity: 68.10 cm/s  RVSP:           21.3 mmHg  MV E/A ratio:  1.39  SHUNTS                             Systemic VTI:  0.31 m                             Systemic Diam: 2.30 cm   Toribio Fuel MD  Electronically signed by Toribio Fuel MD  Signature Date/Time: 11/06/2024/10:46:05 PM        Final      Narrative & Impression  CLINICAL DATA:  Ascending thoracic aortic aneurysm   EXAM: CT ANGIOGRAPHY CHEST WITH CONTRAST   TECHNIQUE: Multidetector CT imaging of the chest was performed using the standard protocol during bolus administration of intravenous contrast. Multiplanar CT image reconstructions and MIPs were obtained to evaluate the vascular anatomy.   RADIATION DOSE REDUCTION: This exam was performed according to the departmental dose-optimization program which includes automated exposure control, adjustment of the mA and/or kV according to patient size and/or use of iterative reconstruction technique.   CONTRAST:  75mL OMNIPAQUE  IOHEXOL  350 MG/ML SOLN   COMPARISON:  12/03/2022   FINDINGS: Cardiovascular: 4.4 cm ascending thoracic aortic aneurysm, not appreciably changed by my measurements since previous study. No evidence of thoracic aortic dissection.   Prominent biatrial dilatation. No pericardial effusion. Mild atherosclerosis of the coronary vasculature.   There is technically adequate opacification of the pulmonary vasculature. No filling defects or pulmonary emboli.   Mediastinum/Nodes: No enlarged mediastinal, hilar, or axillary  lymph nodes. Thyroid gland, trachea, and esophagus demonstrate no significant findings.   Lungs/Pleura: No acute airspace disease, effusion, or pneumothorax. Central airways are patent.   Upper Abdomen: No acute abnormality.   Musculoskeletal: No acute or destructive bony abnormalities. Reconstructed images demonstrate no additional findings.   Review of the MIP images confirms the above findings.   IMPRESSION: 1. Stable 4.4 cm ascending thoracic aortic aneurysm. Recommend annual imaging followup by CTA or MRA. This recommendation follows 2010 ACCF/AHA/AATS/ACR/ASA/SCA/SCAI/SIR/STS/SVM Guidelines for the Diagnosis and Management of Patients with Thoracic Aortic Disease. Circulation. 2010; 121: Z733-z630. Aortic aneurysm NOS (ICD10-I71.9) 2. No acute intrathoracic process.     Electronically Signed   By: Ozell Daring M.D.   On: 11/04/2024 19:35     Impression:  He continues to feel well without sympotms.  His aortic root diameter was measured at 4.4 cm on his recent echocardiogram and CT scan compared to his last TEE when the aortic root diameter was measured at 4.7 cm.  His current echocardiogram shows a trileaflet aortic valve with mild calcification and moderate regurgitation with normal LV internal dimensions and normal left ventricular ejection fraction of 60 to 65%.  There is no indication for surgical treatment at this time and I have recommended continuing to follow with his aortic insufficiency and aortic aneurysm.  I reviewed the echo and CT images with him and answered all of his questions.  Plan:  I will see him back in 1 year with a CTA of the chest and 2D echocardiogram.  I spent 15 minutes performing this established patient evaluation and > 50% of this time was spent face to face counseling and coordinating the care of this patient's aortic insufficiency and aortic aneurysm.   Dorise MARLA Fellers, MD [facility] [phone]

## 2024-11-09 ENCOUNTER — Ambulatory Visit: Admitting: Internal Medicine

## 2024-11-09 ENCOUNTER — Other Ambulatory Visit: Payer: Self-pay

## 2024-11-09 ENCOUNTER — Encounter: Payer: Self-pay | Admitting: Internal Medicine

## 2024-11-09 VITALS — BP 127/78 | HR 69 | Temp 98.4°F | Ht 72.0 in | Wt 228.0 lb

## 2024-11-09 DIAGNOSIS — A498 Other bacterial infections of unspecified site: Secondary | ICD-10-CM | POA: Diagnosis not present

## 2024-11-09 NOTE — Progress Notes (Unsigned)
 Patient: Robert Madden  DOB: [birth year] MRN: [id number] PCP: Leonel Cole, MD  Referring Provider: ***  Chief Complaint  Patient presents with   Follow-up     Patient Active Problem List   Diagnosis Date Noted   Moderate to severe aortic valve regurgitation 08/15/2024   Adenomatous polyp of ascending colon 08/15/2024   Adenomatous polyp of cecum 08/15/2024   Adenomatous polyp of descending colon 08/15/2024   Polyp of sigmoid colon 08/15/2024   Grade I internal hemorrhoids 08/15/2024   Drug rash 01/30/2024   Enterococcus faecalis infection 01/18/2024   Rash 01/18/2024   Aortic valve endocarditis 01/12/2024   Bacteremia 01/07/2024   Abnormal LFTs 01/07/2024   Hyponatremia 01/07/2024   Hypocarbia 01/07/2024   Irritable bowel syndrome without diarrhea 11/20/2020   Aortic root dilatation 11/20/2020   SVT (supraventricular tachycardia) 08/30/2015   Hypercholesterolemia without hypertriglyceridemia 02/25/2015   Aortic aneurysm, thoracic 12/06/2013     Subjective:  Robert Madden is a 63 y.o.  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 He had childhood allergy to PCN but tolerated amoxicillin challenge and was placed on dual beta lactam therapy with continuous IV ampicillin  + ceftriaxone  2 grams IV q 12 hours but then developed a rash within the last 3 days  and was switched to daptomycin .   Discussed the use of AI scribe software for clinical note transcription with the patient, who gave verbal consent to proceed.   History of Present Illness   The patient, with a history of heart valve infection (aortic valve), presents with a rash. He was previously treated with IV ampicillin  and ceftriaxone  for the infection. However, he developed a rash, which he believes is due to an  allergic reaction to ampicillin . The rash is not itchy, but it is widespread, covering his back, chest, and face. The rash started a couple of days ago, initially on his back, and has since spread. He has a history of penicillin allergy from childhood, which was thought to have resolved when he had no problems with the amoxicillin challenge.. He has changed to daptomycin , and the previous antibiotics were stopped yesterday. He also has a PICC line in place, which appears to be in good condition. He also has a history of aortic aneurysm, which is being monitored.        03/27/24: doing well no new complaint  6/30: No new complaints.  Today 11/09/24: No new complaints.  ROS  Past Medical History:  Diagnosis Date   AAA (abdominal aortic aneurysm)    Aortic insufficiency    Drug rash 01/30/2024   Enterococcus faecalis infection 01/18/2024   Hx of colonic polyps    Hyperlipidemia    IBS (irritable bowel syndrome)    Rash 01/18/2024   Tachycardia, paroxysmal (HCC)     Outpatient Medications Prior to Visit  Medication Sig Dispense Refill   ibuprofen (ADVIL) 200 MG tablet Take 400 mg by mouth every 4 (four) hours as needed for headache or mild pain (pain score 1-3).     metoprolol  succinate (TOPROL -XL) 50 MG 24 hr tablet Take 1 tablet (50 mg total) by mouth daily. 90 tablet 3   pravastatin  (PRAVACHOL ) 40 MG tablet Take 1 tablet (40 mg total) by mouth daily. 90 tablet 3   No facility-administered medications prior to visit.  Allergies[1]  Social History[2]  Family History  Problem Relation Age of Onset   Hypertension Mother    AAA (abdominal aortic aneurysm) Mother    Skin cancer Mother    Dementia Mother    Arthritis Maternal Grandmother    Leukemia Paternal Grandfather    Heart attack Neg Hx    Stroke Neg Hx     Objective:   Vitals:   11/09/24 0955  BP: 127/78  Pulse: 69  Temp: 98.4 F (36.9 C)  TempSrc: Oral  SpO2: 96%  Weight: 228 lb (103.4 kg)  Height: 6'  (1.829 m)   Body mass index is 30.92 kg/m.  Physical Exam  Lab Results: Lab Results  Component Value Date   WBC 4.8 02/28/2024   HGB 14.8 02/28/2024   HCT 44.5 02/28/2024   MCV 86.6 02/28/2024   PLT 224 02/28/2024    Lab Results  Component Value Date   CREATININE 1.13 02/28/2024   BUN 13 02/28/2024   NA 136 02/28/2024   K 4.8 02/28/2024   CL 102 02/28/2024   CO2 25 02/28/2024    Lab Results  Component Value Date   ALT 52 (H) 02/28/2024   AST 29 02/28/2024   ALKPHOS 55 01/11/2024   BILITOT 0.6 02/28/2024     Assessment & Plan:  63 year old male presented to [facility] with referral for FUO found to have positive blood cultures admitted with: #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 Possible hx of paronychia. No GI symptoms piror to hospitalization. - Blood cultures  grew E faecalis.  Patient started on vancomycin . - TTE showed concern for mobile mass on native aortic valve, TEE showed small echodensity measuting 0.5cm -MRI brain 9 showed signal abnormality tiny acute to subacute ischemic infarct in high left frontal temporal lobe.  I agree this is more consistent with infarct rather than emboli -TEE showed small echodensity on the noncoronary cusp of aortic valve measuring 0.5 cm concerning vegetation, moderate AI   #Ampicillin  allergy #Back Rash -Also there is some concern of a rash that developed after vancomycin  on his back which is pruritic following first dose of antibiotics to admission. Does not seem  typical of drug rash. Improved at last visit. Tolerated amp challenge. Then developed rash on amp + ctx. Transitioned to daptomycin  to complete abx x 6 weeks  -TTE on 3/26 showed persistence of a mobile density on the noncoronary cusp concerning for vegetation  -Completed abx on 3/22. No HA and  fever.  - 3/31 stable labs, blood Cx ng -Seen by GI; colonoscopy deferred once cardiac issues managed. Colonoscopy on 9/16 noted tubular adenoma, no malignancy -Seen by cards, tee on 4/8 showed small mobile denisty( 0.57x0.22cm) in Av and AI with aortic aneurysm. Referred to CTS(dr. Bartle) -If pt taken to [facility] for arotic valve veg then will do 6 weeks of iV abx from OR given multiple abcomalities and no po options given amp allergry.  Labs are stable, bcx ng on 3/31 as such small density c/w sterile mass.  Patient was seen by cardiothoracic, Dr. Sherrine on 05/03/2024 who noted that echodensity on noncoronary cusp is likely sterile vegetation.  6 month CTA chest showed 4.4 cm ascending thoracic aortic aneurysm. Seen by by CTS on 12/10 who note they will follow for aortic insuff/aneurym. No indicatio for surgery at this time. Reviewed current echo  Plan:  F/U with ID PRN   Loney Stank, MD [facility]   11/09/2024  10:06 AM      [1]  Allergies Allergen Reactions   Ampicillin  Rash    Pt describes itchy rash in 4th grade.  No throat swelling or breathing problems.  He then had amoxicillin challenge but developed fulll body rash on high dose ampicillin  + ceftriaxone    Lipitor [Atorvastatin]     joint aches   Zocor [Simvastatin] Other (See Comments)    Joint aches   Honey Rash  [2]  Social History Tobacco Use   Smoking status: Never   Smokeless tobacco: Never  Vaping Use   Vaping status: Never Used  Substance Use Topics   Alcohol use: No   Drug use: No

## 2024-12-11 ENCOUNTER — Other Ambulatory Visit: Payer: Self-pay | Admitting: Physician Assistant

## 2024-12-11 DIAGNOSIS — I7121 Aneurysm of the ascending aorta, without rupture: Secondary | ICD-10-CM

## 2024-12-11 DIAGNOSIS — Z01812 Encounter for preprocedural laboratory examination: Secondary | ICD-10-CM

## 2024-12-11 DIAGNOSIS — I471 Supraventricular tachycardia, unspecified: Secondary | ICD-10-CM

## 2024-12-11 DIAGNOSIS — E785 Hyperlipidemia, unspecified: Secondary | ICD-10-CM

## 2024-12-12 ENCOUNTER — Telehealth: Payer: Self-pay | Admitting: Pulmonary Disease

## 2024-12-12 NOTE — Telephone Encounter (Signed)
 Patient says he recently had work done the end of the year and he would like a call back to discuss to determine how soon he needs to schedule EP  follow up appointment. Please advise.  Patient also insisted that I make note that he does not want to see Orren Fabry.

## 2024-12-13 NOTE — Telephone Encounter (Signed)
 Spoke with the patient to discuss EP needs. He states that his SVT has been well controlled. He states that the only time that he has any issues is when he runs out of his metoprolol . Otherwise he is doing good and is following with Dr. Lucas for his aortic aneurysm. Advised we can plan to see him on a yearly basis to continue to refill his meds as long as everything is going well. Patient verbalized understanding. He has been scheduled for an appointment with an APP. He was previously seeing Dr. Waddell.

## 2025-04-02 ENCOUNTER — Ambulatory Visit: Admitting: Student
